# Patient Record
Sex: Female | Born: 1955 | Race: Black or African American | Hispanic: No | Smoking: Light tobacco smoker
Health system: Southern US, Community
[De-identification: ages and names within clinical notes are randomized; demographics above are authoritative.]

## PROBLEM LIST (undated history)

## (undated) DIAGNOSIS — E663 Overweight: Secondary | ICD-10-CM

## (undated) DIAGNOSIS — Z780 Asymptomatic menopausal state: Secondary | ICD-10-CM

## (undated) DIAGNOSIS — M5416 Radiculopathy, lumbar region: Secondary | ICD-10-CM

## (undated) DIAGNOSIS — R7303 Prediabetes: Secondary | ICD-10-CM

## (undated) DIAGNOSIS — M75101 Unspecified rotator cuff tear or rupture of right shoulder, not specified as traumatic: Secondary | ICD-10-CM

## (undated) HISTORY — PX: COLONOSCOPY WITH PROPOFOL: SHX5780

## (undated) HISTORY — PX: ABDOMINAL HYSTERECTOMY: SHX81

## (undated) HISTORY — PX: OOPHORECTOMY: SHX86

---

## 2005-02-17 ENCOUNTER — Ambulatory Visit: Payer: Self-pay

## 2005-04-15 ENCOUNTER — Ambulatory Visit: Payer: Self-pay | Admitting: Unknown Physician Specialty

## 2005-09-29 ENCOUNTER — Ambulatory Visit: Payer: Self-pay

## 2005-10-18 ENCOUNTER — Ambulatory Visit: Payer: Self-pay

## 2005-12-06 ENCOUNTER — Emergency Department: Payer: Self-pay | Admitting: Internal Medicine

## 2006-12-28 ENCOUNTER — Ambulatory Visit: Payer: Self-pay | Admitting: Unknown Physician Specialty

## 2007-01-05 ENCOUNTER — Ambulatory Visit: Payer: Self-pay | Admitting: Unknown Physician Specialty

## 2007-01-20 ENCOUNTER — Emergency Department: Payer: Self-pay | Admitting: General Practice

## 2007-05-29 ENCOUNTER — Ambulatory Visit: Payer: Self-pay | Admitting: Orthopaedic Surgery

## 2007-09-20 ENCOUNTER — Ambulatory Visit: Payer: Self-pay

## 2008-12-13 ENCOUNTER — Emergency Department: Payer: Self-pay | Admitting: Emergency Medicine

## 2009-01-14 ENCOUNTER — Ambulatory Visit: Payer: Self-pay

## 2010-01-26 ENCOUNTER — Ambulatory Visit: Payer: Self-pay

## 2011-09-28 ENCOUNTER — Ambulatory Visit: Payer: Self-pay | Admitting: Family Medicine

## 2012-12-27 ENCOUNTER — Ambulatory Visit: Payer: Self-pay | Admitting: Orthopedic Surgery

## 2013-02-05 ENCOUNTER — Ambulatory Visit: Payer: Self-pay | Admitting: Family Medicine

## 2013-06-18 ENCOUNTER — Encounter: Payer: Self-pay | Admitting: Family Medicine

## 2013-06-23 ENCOUNTER — Encounter: Payer: Self-pay | Admitting: Family Medicine

## 2013-07-23 ENCOUNTER — Encounter: Payer: Self-pay | Admitting: Family Medicine

## 2013-09-10 ENCOUNTER — Ambulatory Visit: Payer: Self-pay | Admitting: Gastroenterology

## 2013-09-12 LAB — PATHOLOGY REPORT

## 2013-12-31 ENCOUNTER — Encounter: Payer: Self-pay | Admitting: Physical Medicine and Rehabilitation

## 2014-01-21 ENCOUNTER — Encounter: Payer: Self-pay | Admitting: Physical Medicine and Rehabilitation

## 2014-02-20 ENCOUNTER — Encounter: Payer: Self-pay | Admitting: Physical Medicine and Rehabilitation

## 2015-03-01 ENCOUNTER — Emergency Department: Payer: 59

## 2015-03-01 ENCOUNTER — Other Ambulatory Visit: Payer: Self-pay

## 2015-03-01 ENCOUNTER — Emergency Department
Admission: EM | Admit: 2015-03-01 | Discharge: 2015-03-01 | Disposition: A | Discharge status: Home / Self Care | Payer: 59 | Attending: Emergency Medicine | Admitting: Emergency Medicine

## 2015-03-01 ENCOUNTER — Encounter: Payer: Self-pay | Admitting: General Practice

## 2015-03-01 DIAGNOSIS — K2100 Gastro-esophageal reflux disease with esophagitis, without bleeding: Secondary | ICD-10-CM

## 2015-03-01 DIAGNOSIS — Z87891 Personal history of nicotine dependence: Secondary | ICD-10-CM | POA: Insufficient documentation

## 2015-03-01 DIAGNOSIS — J029 Acute pharyngitis, unspecified: Secondary | ICD-10-CM | POA: Diagnosis not present

## 2015-03-01 DIAGNOSIS — R112 Nausea with vomiting, unspecified: Secondary | ICD-10-CM | POA: Diagnosis present

## 2015-03-01 LAB — TROPONIN I: Troponin I: <0.03 ng/mL (ref <0.031)

## 2015-03-01 LAB — COMPREHENSIVE METABOLIC PANEL
ALBUMIN: 4 g/dL (ref 3.5–5.0)
ALT: 12 U/L — ABNORMAL LOW (ref 14–54)
ANION GAP: 9 (ref 5–15)
AST: 18 U/L (ref 15–41)
Alkaline Phosphatase: 83 U/L (ref 38–126)
BILIRUBIN TOTAL: 0.8 mg/dL (ref 0.3–1.2)
BUN: 14 mg/dL (ref 6–20)
CALCIUM: 9.3 mg/dL (ref 8.9–10.3)
CO2: 27 mmol/L (ref 22–32)
Chloride: 103 mmol/L (ref 101–111)
Creatinine, Ser: 0.93 mg/dL (ref 0.44–1.00)
GFR calc Af Amer: >60 mL/min (ref >60)
Glucose, Bld: 89 mg/dL (ref 65–99)
Potassium: 3.6 mmol/L (ref 3.5–5.1)
Sodium: 139 mmol/L (ref 135–145)
Total Protein: 7.7 g/dL (ref 6.5–8.1)

## 2015-03-01 LAB — CBC WITH DIFFERENTIAL/PLATELET
BASOS PCT: 1 %
Basophils Absolute: 0 10*3/uL (ref 0–0.1)
EOS ABS: 0.1 10*3/uL (ref 0–0.7)
Eosinophils Relative: 3 %
HCT: 38.6 % (ref 35.0–47.0)
Hemoglobin: 12.9 g/dL (ref 12.0–16.0)
LYMPHS ABS: 1.8 10*3/uL (ref 1.0–3.6)
LYMPHS PCT: 37 %
MCH: 31.3 pg (ref 26.0–34.0)
MCHC: 33.3 g/dL (ref 32.0–36.0)
MCV: 93.9 fL (ref 80.0–100.0)
Monocytes Absolute: 0.4 10*3/uL (ref 0.2–0.9)
Monocytes Relative: 7 %
NEUTROS ABS: 2.5 10*3/uL (ref 1.4–6.5)
NEUTROS PCT: 52 %
PLATELETS: 233 10*3/uL (ref 150–440)
RBC: 4.11 MIL/uL (ref 3.80–5.20)
RDW: 13.6 % (ref 11.5–14.5)
WBC: 4.8 10*3/uL (ref 3.6–11.0)

## 2015-03-01 LAB — POCT RAPID STREP A: Streptococcus, Group A Screen (Direct): NEGATIVE

## 2015-03-01 MED ORDER — GI COCKTAIL ~~LOC~~
ORAL | Status: AC
  Administered 2015-03-01: 30 mL via ORAL
  Filled 2015-03-01: qty 30

## 2015-03-01 MED ORDER — GI COCKTAIL ~~LOC~~
30.0000 mL | Freq: Once | ORAL | Status: AC
  Administered 2015-03-01: 30 mL via ORAL

## 2015-03-01 MED ORDER — LIDOCAINE VISCOUS 2 % MT SOLN: 15.0000 mL | OROMUCOSAL | Status: AC | PRN

## 2015-03-01 MED ORDER — IOHEXOL 300 MG/ML  SOLN
75.0000 mL | Freq: Once | INTRAMUSCULAR | Status: AC | PRN
  Administered 2015-03-01: 75 mL via INTRAVENOUS

## 2015-03-01 NOTE — Discharge Instructions (Signed)
Your exam and evaluation are reassuring. I suspect irritation of the esophagus after vomiting overnight. Return to the emergency department for any fever, chest pain, trouble breathing, vomiting blood, black or bloody stools, or any other symptoms concerning to you. Avoid spicy, citrus, caffeinated foods until swallowing feels better.   Esophagitis Esophagitis is inflammation of the esophagus. It can involve swelling, soreness, and pain in the esophagus. This condition can make it difficult and painful to swallow. CAUSES  Most causes of esophagitis are not serious. Many different factors can cause esophagitis, including:  Gastroesophageal reflux disease (GERD). This is when acid from your stomach flows up into the esophagus.  Recurrent vomiting.  An allergic-type reaction.  Certain medicines, especially those that come in large pills.  Ingestion of harmful chemicals, such as household cleaning products.  Heavy alcohol use.  An infection of the esophagus.  Radiation treatment for cancer.  Certain diseases such as sarcoidosis, Crohn's disease, and scleroderma. These diseases may cause recurrent esophagitis. SYMPTOMS   Trouble swallowing.  Painful swallowing.  Chest pain.  Difficulty breathing.  Nausea.  Vomiting.  Abdominal pain. DIAGNOSIS  Your caregiver will take your history and do a physical exam. Depending upon what your caregiver finds, certain tests may also be done, including:  Barium X-ray. You will drink a solution that coats the esophagus, and X-rays will be taken.  Endoscopy. A lighted tube is put down the esophagus so your caregiver can examine the area.  Allergy tests. These can sometimes be arranged through follow-up visits. TREATMENT  Treatment will depend on the cause of your esophagitis. In some cases, steroids or other medicines may be given to help relieve your symptoms or to treat the underlying cause of your condition. Medicines that may be  recommended include:  Viscous lidocaine, to soothe the esophagus.  Antacids.  Acid reducers.  Proton pump inhibitors.  Antiviral medicines for certain viral infections of the esophagus.  Antifungal medicines for certain fungal infections of the esophagus.  Antibiotic medicines, depending on the cause of the esophagitis. HOME CARE INSTRUCTIONS   Avoid foods and drinks that seem to make your symptoms worse.  Eat small, frequent meals instead of large meals.  Avoid eating for the 3 hours prior to your bedtime.  If you have trouble taking pills, use a pill splitter to decrease the size and likelihood of the pill getting stuck or injuring the esophagus on the way down. Drinking water after taking a pill also helps.  Stop smoking if you smoke.  Maintain a healthy weight.  Wear loose-fitting clothing. Do not wear anything tight around your waist that causes pressure on your stomach.  Raise the head of your bed 6 to 8 inches with wood blocks to help you sleep. Extra pillows will not help.  Only take over-the-counter or prescription medicines as directed by your caregiver. SEEK IMMEDIATE MEDICAL CARE IF:  You have severe chest pain that radiates into your arm, neck, or jaw.  You feel sweaty, dizzy, or lightheaded.  You have shortness of breath.  You vomit blood.  You have difficulty or pain with swallowing.  You have bloody or black, tarry stools.  You have a fever.  You have a burning sensation in the chest more than 3 times a week for more than 2 weeks.  You cannot swallow, drink, or eat.  You drool because you cannot swallow your saliva. MAKE SURE YOU:  Understand these instructions.  Will watch your condition.  Will get help right away if you  are not doing well or get worse. Document Released: 09/16/2004 Document Revised: 11/01/2011 Document Reviewed: 04/09/2011 [facility] Patient Information 2015 [employer] [place]. This information is not intended to replace  advice given to you by your health care provider. Make sure you discuss any questions you have with your health care provider.

## 2015-03-01 NOTE — ED Notes (Signed)
Pt. Arrived to ed from home with reports of experiencing nausea and vomiting since 1am this morning. Pt reports that she experienced multiple episodes of vomiting. Pt reports that this morning  "i feel like i have pepper in my throat". Pt alert and oriented. Denies pain at this time.

## 2015-03-01 NOTE — ED Provider Notes (Signed)
[facility] Emergency Department Provider Note   ____________________________________________  Time seen: 4 PM I have reviewed the triage vital signs and the triage nursing note.  HISTORY  Chief Complaint Emesis; Nausea; and Sore Throat   Historian Patient  HPI Marissa Mcdaniel is a 59 y.o. female who is complaining of sore throat and painful swallowing. She reports that around 1 am she woke up and had several episodes of forceful vomiting. It was nonbloody and nonbilious. This morning she is reporting sensation of something in her throat, and pain with swallowing. No trouble breathing or shortness of breath. No fever. She does have sinus congestion and sinus drainage.    History reviewed. No pertinent past medical history.  There are no active problems to display for this patient.   History reviewed. No pertinent past surgical history.  No current outpatient prescriptions on file.  Allergies Review of patient's allergies indicates no known allergies.  No family history on file.  Social History History  Substance Use Topics  . Smoking status: Former Games developer  . Smokeless tobacco: Not on file  . Alcohol Use: Yes    Review of Systems  Constitutional: Negative for fever. Eyes: Negative for visual changes. ENT: Positive per history of present illness. Cardiovascular: Negative for chest pain. Respiratory: Negative for shortness of breath. Gastrointestinal: Negative for abdominal pain, vomiting and diarrhea. No more nausea at this point time Genitourinary: Negative for dysuria. Musculoskeletal: Negative for back pain. Skin: Negative for rash. Neurological: Negative for headaches, focal weakness or numbness. 10 point Review of Systems otherwise negative ____________________________________________   PHYSICAL EXAM:  VITAL SIGNS: ED Triage Vitals  Enc Vitals Group     BP 03/01/15 1426 102/75 mmHg     Pulse Rate 03/01/15 1427 74     Resp  03/01/15 1500 15     Temp 03/01/15 1500 97.7 F (36.5 C)     Temp Source 03/01/15 1500 Oral     SpO2 03/01/15 1427 100 %     Weight 03/01/15 1438 180 lb (81.647 kg)     Height 03/01/15 1438  (1.626 m)     Head Cir --      Peak Flow --      Pain Score 03/01/15 1338 8     Pain Loc --      Pain Edu? --      Excl. in GC? --      Constitutional: Alert and oriented. Well appearing and in no distress. Eyes: Conjunctivae are normal. PERRL. Normal extraocular movements. ENT   Head: Normocephalic and atraumatic.   Nose: No congestion/rhinnorhea.   Mouth/Throat: Mucous membranes are moist. Mild pharyngeal erythema without tonsillar swelling or exudate. Cobblestoning on the posterior pharynx. Normal tongue   Neck: No stridor. No lymphadenopathy. No swelling. Cardiovascular/Chest: Normal rate, regular rhythm.  No murmurs, rubs, or gallops. Respiratory: Normal respiratory effort without tachypnea nor retractions. Breath sounds are clear and equal bilaterally. No wheezes/rales/rhonchi. Gastrointestinal: Soft. No distention, no guarding, no rebound. Nontender  Genitourinary/rectal:Deferred Musculoskeletal: Nontender with normal range of motion in all extremities. No joint effusions.  No lower extremity tenderness nor edema. Neurologic:  Normal speech and language. No gross focal neurologic deficits are appreciated. Skin:  Skin is warm, dry and intact. No rash noted. Psychiatric: Mood and affect are normal. Speech and behavior are normal. Patient exhibits appropriate insight and judgment.  ____________________________________________   EKG I, Governor Rooks, MD, the attending physician have personally viewed and interpreted all ECGs.  65 bpm.  Normal sinus rhythm. Narrow QRS. Normal axis. Flattened T-wave inferior. ____________________________________________  LABS (pertinent positives/negatives)  Troponin less than 0.03 Complete metabolic panel and CBC within normal  limits Rapid strep negative ____________________________________________  RADIOLOGY All Xrays were viewed by me. Imaging interpreted by Radiologist.  Soft tissue neck: Negative  CT neck: Mild prominence of the tonsils and otherwise negative __________________________________________  PROCEDURES  Procedure(s) performed: None Critical Care performed: None  ____________________________________________   ED COURSE / ASSESSMENT AND PLAN  CONSULTATIONS: None   Pertinent labs & imaging results that were available during my care of the patient were reviewed by me and considered in my medical decision making (see chart for details).   Patient's symptoms sound like she sustained irritation/burning to esophagus after throwing up yesterday. No symptoms to suggest mediastinitis or esophageal rupture. Nurse did witness the patient to be gagging and then coughing with a little bit of stridor. When I went to evaluate her she was no having any breathing problems with no stridor arrest. She did get some relief from the GI cocktail.  X-rays of the neck and the CT of the neck were reassuring for no mass or other abnormality. Exam and evaluation are reassuring. Patient to follow with PCP.    Patient / Family / Caregiver informed of clinical course, medical decision-making process, and agree with plan.   I discussed return precautions, follow-up instructions, and discharged instructions with patient and/or family.  ___________________________________________   FINAL CLINICAL IMPRESSION(S) / ED DIAGNOSES   Final diagnoses:  Esophagitis, reflux    FOLLOW UP  [facility]   Governor Rooksebecca Zakk Borgen, MD 03/01/15 2000

## 2015-03-04 LAB — CULTURE, GROUP A STREP (THRC)

## 2015-03-10 ENCOUNTER — Other Ambulatory Visit: Payer: Self-pay | Admitting: Family Medicine

## 2015-03-10 DIAGNOSIS — Z1231 Encounter for screening mammogram for malignant neoplasm of breast: Secondary | ICD-10-CM

## 2015-03-17 ENCOUNTER — Ambulatory Visit: Payer: 59

## 2015-03-19 ENCOUNTER — Ambulatory Visit
Admission: RE | Admit: 2015-03-19 | Discharge: 2015-03-19 | Disposition: A | Discharge status: Home / Self Care | Payer: 59 | Source: Ambulatory Visit | Attending: Family Medicine | Admitting: Family Medicine

## 2015-03-19 DIAGNOSIS — Z1231 Encounter for screening mammogram for malignant neoplasm of breast: Secondary | ICD-10-CM | POA: Insufficient documentation

## 2015-11-11 ENCOUNTER — Other Ambulatory Visit: Payer: Self-pay | Admitting: Physical Medicine and Rehabilitation

## 2015-11-11 DIAGNOSIS — M5417 Radiculopathy, lumbosacral region: Secondary | ICD-10-CM

## 2015-12-01 ENCOUNTER — Other Ambulatory Visit: Payer: Self-pay | Admitting: Physical Medicine and Rehabilitation

## 2015-12-01 DIAGNOSIS — M5416 Radiculopathy, lumbar region: Secondary | ICD-10-CM

## 2015-12-02 ENCOUNTER — Ambulatory Visit: Payer: BLUE CROSS/BLUE SHIELD

## 2015-12-15 ENCOUNTER — Inpatient Hospital Stay: Admission: RE | Admit: 2015-12-15 | Payer: BLUE CROSS/BLUE SHIELD | Source: Ambulatory Visit

## 2015-12-15 ENCOUNTER — Ambulatory Visit
Admission: RE | Admit: 2015-12-15 | Discharge: 2015-12-15 | Disposition: A | Discharge status: Home / Self Care | Payer: BLUE CROSS/BLUE SHIELD | Source: Ambulatory Visit | Attending: Physical Medicine and Rehabilitation | Admitting: Physical Medicine and Rehabilitation

## 2015-12-15 DIAGNOSIS — M5416 Radiculopathy, lumbar region: Secondary | ICD-10-CM

## 2015-12-19 ENCOUNTER — Other Ambulatory Visit: Payer: BLUE CROSS/BLUE SHIELD

## 2015-12-19 ENCOUNTER — Ambulatory Visit: Payer: BLUE CROSS/BLUE SHIELD

## 2015-12-22 ENCOUNTER — Other Ambulatory Visit: Payer: BLUE CROSS/BLUE SHIELD

## 2015-12-22 ENCOUNTER — Ambulatory Visit: Payer: BLUE CROSS/BLUE SHIELD

## 2016-05-26 ENCOUNTER — Other Ambulatory Visit: Payer: Self-pay | Admitting: Family Medicine

## 2016-05-26 DIAGNOSIS — Z1231 Encounter for screening mammogram for malignant neoplasm of breast: Secondary | ICD-10-CM

## 2016-06-22 ENCOUNTER — Ambulatory Visit
Admission: RE | Admit: 2016-06-22 | Discharge: 2016-06-22 | Disposition: A | Discharge status: Home / Self Care | Payer: BLUE CROSS/BLUE SHIELD | Source: Ambulatory Visit | Attending: Family Medicine | Admitting: Family Medicine

## 2016-06-22 DIAGNOSIS — Z1231 Encounter for screening mammogram for malignant neoplasm of breast: Secondary | ICD-10-CM | POA: Diagnosis not present

## 2016-06-23 ENCOUNTER — Other Ambulatory Visit: Payer: Self-pay | Admitting: Family Medicine

## 2016-06-23 DIAGNOSIS — N6489 Other specified disorders of breast: Secondary | ICD-10-CM

## 2016-07-20 ENCOUNTER — Ambulatory Visit
Admission: RE | Admit: 2016-07-20 | Discharge: 2016-07-20 | Disposition: A | Discharge status: Home / Self Care | Payer: BLUE CROSS/BLUE SHIELD | Source: Ambulatory Visit | Attending: Family Medicine | Admitting: Family Medicine

## 2016-07-20 DIAGNOSIS — R928 Other abnormal and inconclusive findings on diagnostic imaging of breast: Secondary | ICD-10-CM | POA: Diagnosis not present

## 2016-07-20 DIAGNOSIS — N6489 Other specified disorders of breast: Secondary | ICD-10-CM | POA: Diagnosis present

## 2017-10-04 ENCOUNTER — Other Ambulatory Visit: Payer: Self-pay | Admitting: Family Medicine

## 2017-10-04 DIAGNOSIS — Z1231 Encounter for screening mammogram for malignant neoplasm of breast: Secondary | ICD-10-CM

## 2017-12-06 ENCOUNTER — Ambulatory Visit
Admission: RE | Admit: 2017-12-06 | Discharge: 2017-12-06 | Disposition: A | Discharge status: Home / Self Care | Payer: BLUE CROSS/BLUE SHIELD | Source: Ambulatory Visit | Attending: Family Medicine | Admitting: Family Medicine

## 2017-12-06 DIAGNOSIS — Z1231 Encounter for screening mammogram for malignant neoplasm of breast: Secondary | ICD-10-CM | POA: Diagnosis not present

## 2018-08-02 DIAGNOSIS — R0789 Other chest pain: Secondary | ICD-10-CM | POA: Diagnosis not present

## 2018-08-02 DIAGNOSIS — M7581 Other shoulder lesions, right shoulder: Secondary | ICD-10-CM | POA: Diagnosis not present

## 2018-09-27 DIAGNOSIS — Z0000 Encounter for general adult medical examination without abnormal findings: Secondary | ICD-10-CM | POA: Diagnosis not present

## 2018-09-27 DIAGNOSIS — R7303 Prediabetes: Secondary | ICD-10-CM | POA: Diagnosis not present

## 2018-10-02 DIAGNOSIS — M159 Polyosteoarthritis, unspecified: Secondary | ICD-10-CM | POA: Diagnosis not present

## 2018-10-02 DIAGNOSIS — R7303 Prediabetes: Secondary | ICD-10-CM | POA: Diagnosis not present

## 2018-11-13 ENCOUNTER — Other Ambulatory Visit: Payer: Self-pay | Admitting: Family Medicine

## 2018-11-13 DIAGNOSIS — Z1231 Encounter for screening mammogram for malignant neoplasm of breast: Secondary | ICD-10-CM

## 2018-11-17 DIAGNOSIS — M25561 Pain in right knee: Secondary | ICD-10-CM | POA: Diagnosis not present

## 2018-11-17 DIAGNOSIS — M1711 Unilateral primary osteoarthritis, right knee: Secondary | ICD-10-CM | POA: Diagnosis not present

## 2019-01-26 ENCOUNTER — Other Ambulatory Visit: Payer: Self-pay | Admitting: Orthopedic Surgery

## 2019-01-26 ENCOUNTER — Other Ambulatory Visit (HOSPITAL_COMMUNITY): Payer: Self-pay | Admitting: Orthopedic Surgery

## 2019-01-26 DIAGNOSIS — M25561 Pain in right knee: Secondary | ICD-10-CM

## 2019-02-07 ENCOUNTER — Other Ambulatory Visit: Payer: Self-pay

## 2019-04-04 ENCOUNTER — Ambulatory Visit
Admission: RE | Admit: 2019-04-04 | Discharge: 2019-04-04 | Disposition: A | Discharge status: Home / Self Care | Payer: 59 | Source: Ambulatory Visit | Attending: Family Medicine | Admitting: Family Medicine

## 2019-04-04 DIAGNOSIS — Z1231 Encounter for screening mammogram for malignant neoplasm of breast: Secondary | ICD-10-CM | POA: Diagnosis present

## 2019-06-29 ENCOUNTER — Other Ambulatory Visit
Admission: RE | Admit: 2019-06-29 | Discharge: 2019-06-29 | Disposition: A | Discharge status: Home / Self Care | Payer: 59 | Source: Ambulatory Visit | Attending: Internal Medicine | Admitting: Internal Medicine

## 2019-06-29 ENCOUNTER — Other Ambulatory Visit: Payer: Self-pay

## 2019-06-29 DIAGNOSIS — Z20828 Contact with and (suspected) exposure to other viral communicable diseases: Secondary | ICD-10-CM | POA: Insufficient documentation

## 2019-06-29 DIAGNOSIS — Z01812 Encounter for preprocedural laboratory examination: Secondary | ICD-10-CM | POA: Diagnosis not present

## 2019-06-29 LAB — SARS CORONAVIRUS 2 (TAT 6-24 HRS): SARS Coronavirus 2: NEGATIVE

## 2019-07-03 ENCOUNTER — Encounter: Payer: Self-pay | Admitting: *Deleted

## 2019-07-04 ENCOUNTER — Encounter
Admission: RE | Disposition: A | Discharge status: Home / Self Care | Payer: Self-pay | Source: Home / Self Care | Attending: Internal Medicine

## 2019-07-04 ENCOUNTER — Encounter: Payer: Self-pay | Admitting: Emergency Medicine

## 2019-07-04 ENCOUNTER — Ambulatory Visit: Payer: 59 | Admitting: Certified Registered Nurse Anesthetist

## 2019-07-04 ENCOUNTER — Ambulatory Visit
Admission: RE | Admit: 2019-07-04 | Discharge: 2019-07-04 | Disposition: A | Discharge status: Home / Self Care | Payer: 59 | Attending: Internal Medicine | Admitting: Internal Medicine

## 2019-07-04 DIAGNOSIS — Z800 Family history of malignant neoplasm of digestive organs: Secondary | ICD-10-CM | POA: Insufficient documentation

## 2019-07-04 DIAGNOSIS — K5730 Diverticulosis of large intestine without perforation or abscess without bleeding: Secondary | ICD-10-CM | POA: Insufficient documentation

## 2019-07-04 DIAGNOSIS — Z79899 Other long term (current) drug therapy: Secondary | ICD-10-CM | POA: Diagnosis not present

## 2019-07-04 DIAGNOSIS — K640 First degree hemorrhoids: Secondary | ICD-10-CM | POA: Diagnosis not present

## 2019-07-04 DIAGNOSIS — E663 Overweight: Secondary | ICD-10-CM | POA: Diagnosis not present

## 2019-07-04 DIAGNOSIS — Z6830 Body mass index (BMI) 30.0-30.9, adult: Secondary | ICD-10-CM | POA: Diagnosis not present

## 2019-07-04 DIAGNOSIS — F17200 Nicotine dependence, unspecified, uncomplicated: Secondary | ICD-10-CM | POA: Insufficient documentation

## 2019-07-04 DIAGNOSIS — Z1211 Encounter for screening for malignant neoplasm of colon: Secondary | ICD-10-CM | POA: Diagnosis not present

## 2019-07-04 HISTORY — DX: Unspecified rotator cuff tear or rupture of right shoulder, not specified as traumatic: M75.101

## 2019-07-04 HISTORY — PX: COLONOSCOPY WITH PROPOFOL: SHX5780

## 2019-07-04 HISTORY — DX: Radiculopathy, lumbar region: M54.16

## 2019-07-04 HISTORY — DX: Prediabetes: R73.03

## 2019-07-04 HISTORY — DX: Overweight: E66.3

## 2019-07-04 HISTORY — DX: Asymptomatic menopausal state: Z78.0

## 2019-07-04 SURGERY — COLONOSCOPY WITH PROPOFOL
Anesthesia: General

## 2019-07-04 MED ORDER — PROPOFOL 10 MG/ML IV BOLUS
INTRAVENOUS | Status: DC | PRN
  Administered 2019-07-04: 40 mg via INTRAVENOUS
  Administered 2019-07-04: 70 mg via INTRAVENOUS

## 2019-07-04 MED ORDER — LIDOCAINE HCL (PF) 2 % IJ SOLN
INTRAMUSCULAR | Status: AC
  Filled 2019-07-04: qty 10

## 2019-07-04 MED ORDER — PROPOFOL 500 MG/50ML IV EMUL
INTRAVENOUS | Status: DC | PRN
  Administered 2019-07-04: 150 ug/kg/min via INTRAVENOUS

## 2019-07-04 MED ORDER — SODIUM CHLORIDE 0.9 % IV SOLN
INTRAVENOUS | Status: DC
  Administered 2019-07-04: 14:00:00 1000 mL via INTRAVENOUS
  Administered 2019-07-04: 14:00:00 via INTRAVENOUS

## 2019-07-04 MED ORDER — LIDOCAINE HCL (CARDIAC) PF 100 MG/5ML IV SOSY
PREFILLED_SYRINGE | INTRAVENOUS | Status: DC | PRN
  Administered 2019-07-04: 50 mg via INTRAVENOUS

## 2019-07-04 MED ORDER — PROPOFOL 10 MG/ML IV BOLUS
INTRAVENOUS | Status: AC
  Filled 2019-07-04: qty 20

## 2019-07-04 NOTE — Op Note (Signed)
[facility] Gastroenterology Patient Name: Marissa Mcdaniel Procedure Date: 07/04/2019 2:06 PM MRN: [id number] Account #: [id number] Date of Birth: 05-19-1956 Admit Type: Outpatient Age: 63 Room: [facility] ENDO ROOM 3 Gender: Female Note Status: Finalized Procedure:             Colonoscopy Indications:           Screening in patient at increased risk: Family history                         of 1st-degree relative with colorectal cancer Providers:             Boykin Nearing. Johnney Scarlata MD, MD Medicines:             Propofol per Anesthesia Complications:         No immediate complications. Procedure:             Pre-Anesthesia Assessment:                        - The risks and benefits of the procedure and the                         sedation options and risks were discussed with the                         patient. All questions were answered and informed                         consent was obtained.                        - Patient identification and proposed procedure were                         verified prior to the procedure by the nurse. The                         procedure was verified in the procedure room.                        - ASA Grade Assessment: III - A patient with severe                         systemic disease.                        - After reviewing the risks and benefits, the patient                         was deemed in satisfactory condition to undergo the                         procedure.                        After obtaining informed consent, the colonoscope was                         passed under direct vision. Throughout the procedure,  the patient's blood pressure, pulse, and oxygen                         saturations were monitored continuously. The                         Colonoscope was introduced through the anus and                         advanced to the the cecum, identified by appendiceal   orifice and ileocecal valve. The colonoscopy was                         performed with ease. The patient tolerated the                         procedure well. The quality of the bowel preparation                         was excellent. The ileocecal valve, appendiceal                         orifice, and rectum were photographed. Findings:      The perianal and digital rectal examinations were normal. Pertinent       negatives include normal sphincter tone and no palpable rectal lesions.      Many medium-mouthed diverticula were found in the entire colon. There       was no evidence of diverticular bleeding.      Non-bleeding internal hemorrhoids were found during retroflexion. The       hemorrhoids were Grade I (internal hemorrhoids that do not prolapse).      The exam was otherwise without abnormality. Impression:            - Moderate diverticulosis in the entire examined                         colon. There was no evidence of diverticular bleeding.                        - Non-bleeding internal hemorrhoids.                        - The examination was otherwise normal.                        - No specimens collected. Recommendation:        - Patient has a contact number available for                         emergencies. The signs and symptoms of potential                         delayed complications were discussed with the patient.                         Return to normal activities tomorrow. Written                         discharge instructions were provided to the  patient.                        - Resume previous diet.                        - Continue present medications.                        - Repeat colonoscopy in 5 years for screening purposes.                        - Return to GI office PRN. Procedure Code(s):     --- Professional ---                        Y1856, Colorectal cancer screening; colonoscopy on                         individual at high risk Diagnosis  Code(s):     --- Professional ---                        K57.30, Diverticulosis of large intestine without                         perforation or abscess without bleeding                        K64.0, First degree hemorrhoids                        Z80.0, Family history of malignant neoplasm of                         digestive organs CPT copyright 2019 American Medical Association. All rights reserved. The codes documented in this report are preliminary and upon coder review may  be revised to meet current compliance requirements. Efrain Sella MD, MD 07/04/2019 2:33:20 PM This report has been signed electronically. Number of Addenda: 0 Note Initiated On: 07/04/2019 2:06 PM Scope Withdrawal Time: 0 hours 6 minutes 28 seconds  Total Procedure Duration: 0 hours 10 minutes 52 seconds  Estimated Blood Loss:  Estimated blood loss: none.      [facility]

## 2019-07-04 NOTE — Anesthesia Preprocedure Evaluation (Signed)
Anesthesia Evaluation  Patient identified by MRN, date of birth, ID band Patient awake    Reviewed: Allergy & Precautions, NPO status , Patient's Chart, lab work & pertinent test results  History of Anesthesia Complications Negative for: history of anesthetic complications  Airway Mallampati: II  TM Distance: >3 FB Neck ROM: Full    Dental no notable dental hx.    Pulmonary neg sleep apnea, neg COPD, Current Smoker and Patient abstained from smoking.,    breath sounds clear to auscultation- rhonchi (-) wheezing      Cardiovascular Exercise Tolerance: Good (-) hypertension(-) CAD and (-) Past MI  Rhythm:Regular Rate:Normal - Systolic murmurs and - Diastolic murmurs    Neuro/Psych negative neurological ROS  negative psych ROS   GI/Hepatic negative GI ROS, Neg liver ROS,   Endo/Other  negative endocrine ROSneg diabetes  Renal/GU negative Renal ROS     Musculoskeletal negative musculoskeletal ROS (+)   Abdominal (+) + obese,   Peds  Hematology negative hematology ROS (+)   Anesthesia Other Findings Past Medical History: No date: Borderline diabetic No date: Lumbar radiculopathy No date: Menopause No date: Overweight No date: Rotator cuff syndrome of right shoulder   Reproductive/Obstetrics                             Anesthesia Physical Anesthesia Plan  ASA: II  Anesthesia Plan: General   Post-op Pain Management:    Induction: Intravenous  PONV Risk Score and Plan: 1 and Propofol infusion  Airway Management Planned: Natural Airway  Additional Equipment:   Intra-op Plan:   Post-operative Plan:   Informed Consent: I have reviewed the patients History and Physical, chart, labs and discussed the procedure including the risks, benefits and alternatives for the proposed anesthesia with the patient or authorized representative who has indicated his/her understanding and acceptance.      Dental advisory given  Plan Discussed with: CRNA and Anesthesiologist  Anesthesia Plan Comments:         Anesthesia Quick Evaluation

## 2019-07-04 NOTE — Anesthesia Post-op Follow-up Note (Signed)
Anesthesia QCDR form completed.        

## 2019-07-04 NOTE — Transfer of Care (Signed)
Immediate Anesthesia Transfer of Care Note  Patient: Marissa Mcdaniel  Procedure(s) Performed: COLONOSCOPY WITH PROPOFOL (N/A )  Patient Location: PACU  Anesthesia Type:General  Level of Consciousness: sedated  Airway & Oxygen Therapy: Patient Spontanous Breathing and Patient connected to nasal cannula oxygen  Post-op Assessment: Report given to RN and Post -op Vital signs reviewed and stable  Post vital signs: Reviewed and stable  Last Vitals:  Vitals Value Taken Time  BP    Temp    Pulse 89 07/04/19 1433  Resp 17 07/04/19 1433  SpO2 96 % 07/04/19 1433  Vitals shown include unvalidated device data.  Last Pain:  Vitals:   07/04/19 1327  TempSrc: Temporal  PainSc: 0-No pain         Complications: No apparent anesthesia complications

## 2019-07-04 NOTE — H&P (Signed)
Outpatient short stay form Pre-procedure 07/04/2019 12:55 PM Marissa Mcdaniel K. Alice Reichert, M.D.  Primary Physician: Dion Body, M.D.  Reason for visit: Family history of colon cancer.  History of present illness:  Last colonoscopy in Jan 2015 was unremarkable. Patient denies change in bowel habits, rectal bleeding, weight loss or abdominal pain.     No current facility-administered medications for this encounter.   Current Outpatient Medications:  .  Calcium Carbonate-Vitamin D (CALTRATE 600+D PO), Take by mouth., Disp: , Rfl:  .  escitalopram (LEXAPRO) 5 MG tablet, Take 5 mg by mouth daily., Disp: , Rfl:  .  traMADol (ULTRAM) 50 MG tablet, Take by mouth every 6 (six) hours as needed., Disp: , Rfl:  .  lidocaine (XYLOCAINE) 2 % solution, Use as directed 15 mLs in the mouth or throat every 4 (four) hours as needed (throat pain)., Disp: 100 mL, Rfl: 0  No medications prior to admission.     Allergies  Allergen Reactions  . Lodine [Etodolac] Hives     Past Medical History:  Diagnosis Date  . Borderline diabetic   . Lumbar radiculopathy   . Menopause   . Overweight   . Rotator cuff syndrome of right shoulder     Review of systems:  Otherwise negative.    Physical Exam  Gen: Alert, oriented. Appears stated age.  HEENT: /AT. PERRLA. Lungs: CTA, no wheezes. CV: RR nl S1, S2. Abd: soft, benign, no masses. BS+ Ext: No edema. Pulses 2+    Planned procedures: Proceed with colonoscopy. The patient understands the nature of the planned procedure, indications, risks, alternatives and potential complications including but not limited to bleeding, infection, perforation, damage to internal organs and possible oversedation/side effects from anesthesia. The patient agrees and gives consent to proceed.  Please refer to procedure notes for findings, recommendations and patient disposition/instructions.     Kayte Borchard K. Alice Reichert, M.D. Gastroenterology 07/04/2019  12:55 PM

## 2019-07-04 NOTE — Anesthesia Postprocedure Evaluation (Signed)
Anesthesia Post Note  Patient: Marissa Mcdaniel  Procedure(s) Performed: COLONOSCOPY WITH PROPOFOL (N/A )  Patient location during evaluation: Endoscopy Anesthesia Type: General Level of consciousness: awake and alert and oriented Pain management: pain level controlled Vital Signs Assessment: post-procedure vital signs reviewed and stable Respiratory status: spontaneous breathing, nonlabored ventilation and respiratory function stable Cardiovascular status: blood pressure returned to baseline and stable Postop Assessment: no signs of nausea or vomiting Anesthetic complications: no     Last Vitals:  Vitals:   07/04/19 1327 07/04/19 1433  BP: 101/66   Pulse: 76   Resp: 17   Temp: (!) 36.1 C (!) 36.1 C  SpO2: 100%     Last Pain:  Vitals:   07/04/19 1433  TempSrc: Temporal  PainSc:                  Axtyn Woehler

## 2019-07-05 ENCOUNTER — Encounter: Payer: Self-pay | Admitting: Internal Medicine

## 2019-07-30 ENCOUNTER — Ambulatory Visit: Admit: 2019-07-30 | Payer: 59 | Admitting: Internal Medicine

## 2019-07-30 SURGERY — COLONOSCOPY WITH PROPOFOL
Anesthesia: General

## 2019-10-05 ENCOUNTER — Ambulatory Visit: Payer: Self-pay | Attending: Internal Medicine

## 2019-10-05 DIAGNOSIS — Z23 Encounter for immunization: Secondary | ICD-10-CM | POA: Insufficient documentation

## 2019-10-05 NOTE — Progress Notes (Signed)
   Covid-19 Vaccination Clinic  Name:  Marissa Mcdaniel    MRN: [id number] DOB: 12/16/1955  10/05/2019  [name] was observed post Covid-19 immunization for 15 minutes without incidence. She was provided with Vaccine Information Sheet and instruction to access the V-Safe system.   [name] was instructed to call 911 with any severe reactions post vaccine: Marland Kitchen Difficulty breathing  . Swelling of your face and throat  . A fast heartbeat  . A bad rash all over your body  . Dizziness and weakness    Immunizations Administered    Name Date Dose VIS Date Route   Moderna COVID-19 Vaccine 10/05/2019 11:05 AM 0.5 mL 07/24/2019 Intramuscular   Manufacturer: Moderna   Lot: [id number]   NDC: 59470-761-51

## 2019-11-05 ENCOUNTER — Ambulatory Visit: Payer: Self-pay | Attending: Internal Medicine

## 2019-11-05 DIAGNOSIS — Z23 Encounter for immunization: Secondary | ICD-10-CM

## 2019-11-05 NOTE — Progress Notes (Signed)
   Covid-19 Vaccination Clinic  Name:  Tishie Altmann    MRN: [id number] DOB: Aug 18, 1956  11/05/2019  Ms. Boettger was observed post Covid-19 immunization for 15 minutes without incident. She was provided with Vaccine Information Sheet and instruction to access the V-Safe system.   Ms. Pledger was instructed to call 911 with any severe reactions post vaccine: Marland Kitchen Difficulty breathing  . Swelling of face and throat  . A fast heartbeat  . A bad rash all over body  . Dizziness and weakness   Immunizations Administered    Name Date Dose VIS Date Route   Moderna COVID-19 Vaccine 11/05/2019 10:58 AM 0.5 mL 07/24/2019 Intramuscular   Manufacturer: Moderna   Lot: [id number]   NDC: 87867-672-09

## 2019-12-24 ENCOUNTER — Other Ambulatory Visit: Payer: Self-pay

## 2020-03-06 ENCOUNTER — Other Ambulatory Visit: Payer: Self-pay | Admitting: Family Medicine

## 2020-03-06 DIAGNOSIS — M159 Polyosteoarthritis, unspecified: Secondary | ICD-10-CM

## 2020-03-06 DIAGNOSIS — Z1231 Encounter for screening mammogram for malignant neoplasm of breast: Secondary | ICD-10-CM

## 2020-03-06 DIAGNOSIS — M5136 Other intervertebral disc degeneration, lumbar region: Secondary | ICD-10-CM

## 2020-03-06 DIAGNOSIS — G8929 Other chronic pain: Secondary | ICD-10-CM

## 2020-03-18 ENCOUNTER — Other Ambulatory Visit: Payer: Self-pay

## 2020-04-14 ENCOUNTER — Ambulatory Visit
Admission: RE | Admit: 2020-04-14 | Discharge: 2020-04-14 | Disposition: A | Discharge status: Home / Self Care | Payer: 59 | Source: Ambulatory Visit | Attending: Family Medicine | Admitting: Family Medicine

## 2020-04-14 ENCOUNTER — Other Ambulatory Visit: Payer: Self-pay

## 2020-04-14 DIAGNOSIS — Z1231 Encounter for screening mammogram for malignant neoplasm of breast: Secondary | ICD-10-CM | POA: Insufficient documentation

## 2021-03-04 ENCOUNTER — Other Ambulatory Visit: Payer: Self-pay | Admitting: Family Medicine

## 2021-03-04 DIAGNOSIS — N644 Mastodynia: Secondary | ICD-10-CM

## 2021-03-10 ENCOUNTER — Other Ambulatory Visit: Payer: 59

## 2021-03-10 ENCOUNTER — Inpatient Hospital Stay: Admission: RE | Admit: 2021-03-10 | Payer: 59 | Source: Ambulatory Visit

## 2021-06-03 ENCOUNTER — Ambulatory Visit: Payer: Self-pay

## 2021-06-23 ENCOUNTER — Ambulatory Visit
Admission: RE | Admit: 2021-06-23 | Discharge: 2021-06-23 | Disposition: A | Discharge status: Home / Self Care | Payer: Self-pay | Source: Ambulatory Visit | Attending: Oncology | Admitting: Oncology

## 2021-06-23 ENCOUNTER — Other Ambulatory Visit: Payer: Self-pay

## 2021-06-23 ENCOUNTER — Ambulatory Visit: Payer: Self-pay | Attending: Oncology

## 2021-06-23 VITALS — BP 103/64 | HR 69 | Temp 97.6°F | Resp 18 | Wt 191.7 lb

## 2021-06-23 DIAGNOSIS — Z0000 Encounter for general adult medical examination without abnormal findings: Secondary | ICD-10-CM

## 2021-06-23 DIAGNOSIS — N644 Mastodynia: Secondary | ICD-10-CM

## 2021-06-23 NOTE — Progress Notes (Signed)
  Subjective:     Patient ID: Rennie Natter, female   DOB: [birth year], 65 y.o.   MRN: [id number]  HPI   Review of Systems     Objective:   Physical Exam Chest:  Breasts:    Right: No swelling, bleeding, inverted nipple, mass, nipple discharge, skin change or tenderness.     Left: Tenderness present. No swelling, bleeding, inverted nipple, mass, nipple discharge or skin change.       Comments: Left lower outer quadrant pain.      Assessment:     65 year old patient presents for [facility] clinic visit Patient screened, and meets [facility] eligibility.  Patient does not have insurance, Medicare or Medicaid.  Instructed patient on breast self awareness using teach back method Clinical breast exam reveals left lower outer quadrant pain on palpation.  Otherwise normal exam.   Risk Assessment     Risk Scores       06/23/2021   Last edited by: Stephanie Coup S, CMA   5-year risk: 1.2 %   Lifetime risk: 4.9 %               Plan:     Sent for bilateral diagnostic mammogram.

## 2021-06-24 NOTE — Progress Notes (Signed)
Letter mailed from [facility] to notify of normal mammogram results.  Patient to return in one year for annual screening.  Copy to [facility]. 

## 2022-05-18 IMAGING — US US BREAST*L* LIMITED INC AXILLA
1 series · 8 of 8 positions shown · non-contrast
Comparison: Previous exam(s).

CLINICAL DATA: Patient presents for left lateral breast pain.

EXAM:
DIGITAL DIAGNOSTIC BILATERAL MAMMOGRAM WITH TOMOSYNTHESIS AND CAD;
ULTRASOUND LEFT BREAST LIMITED
TECHNIQUE: Bilateral digital diagnostic mammography and breast tomosynthesis
was performed. The images were evaluated with computer-aided
detection.; Targeted ultrasound examination of the left breast was
performed.

[Series 1: us breast*left* limited inc axilla · 0.07mm/px · 8 of 8 slices shown]
[im 1/8]
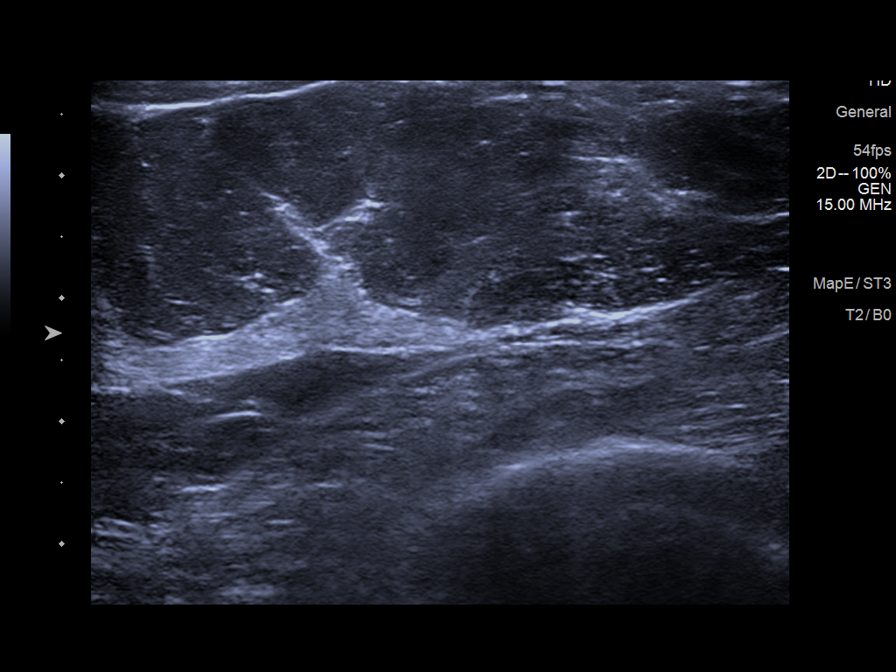
[im 2/8]
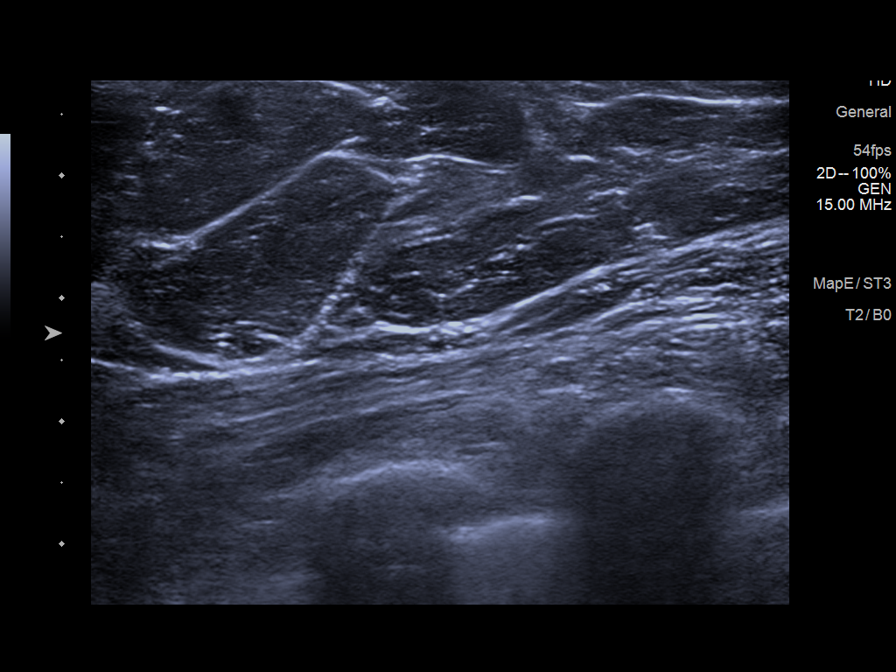
[im 3/8]
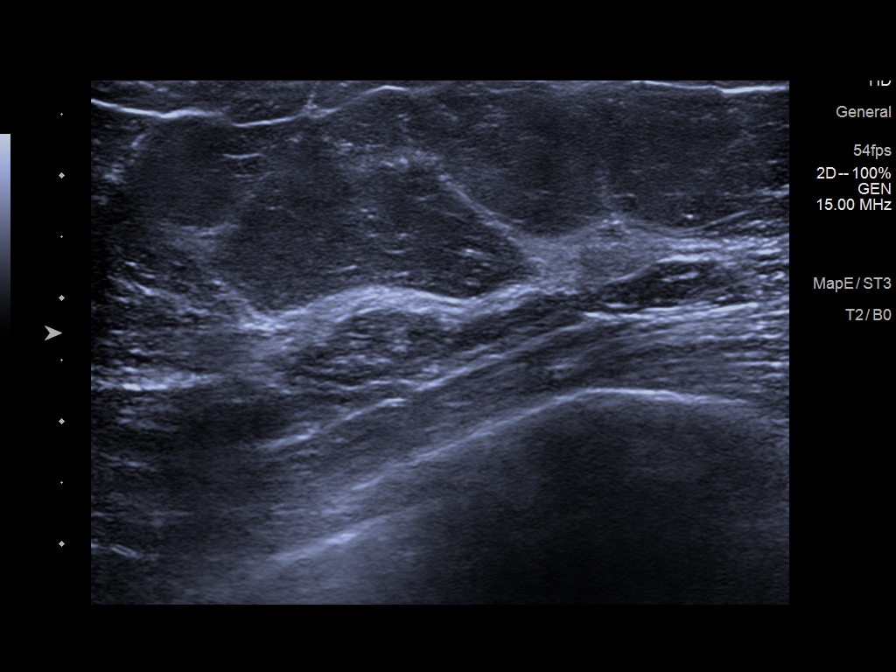
[im 4/8]
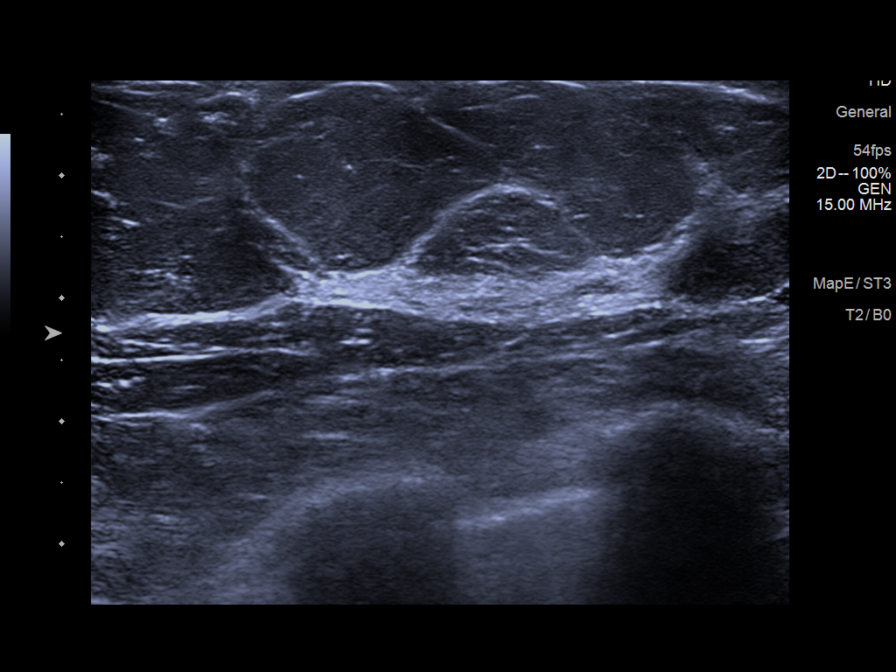
[im 5/8]
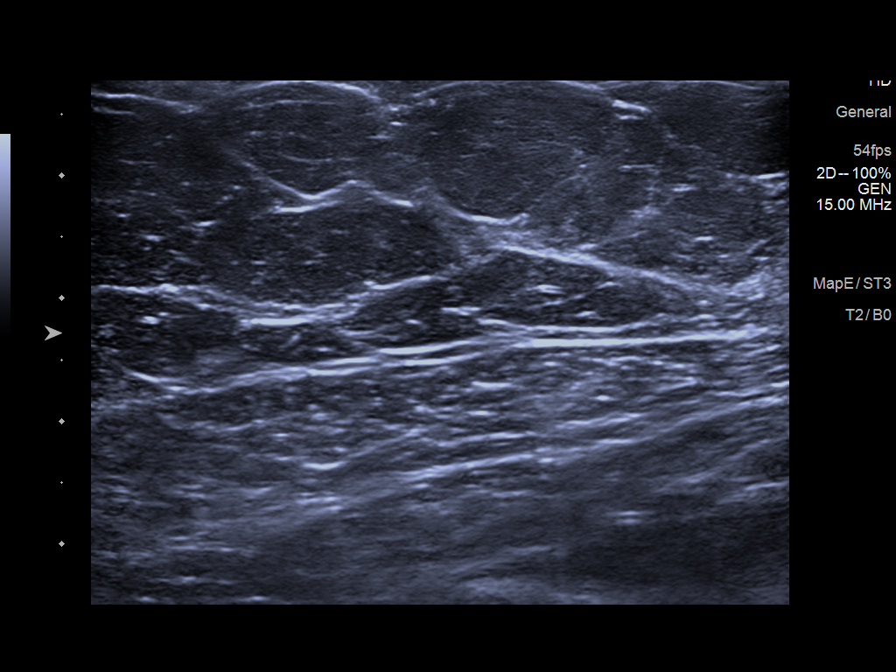
[im 6/8]
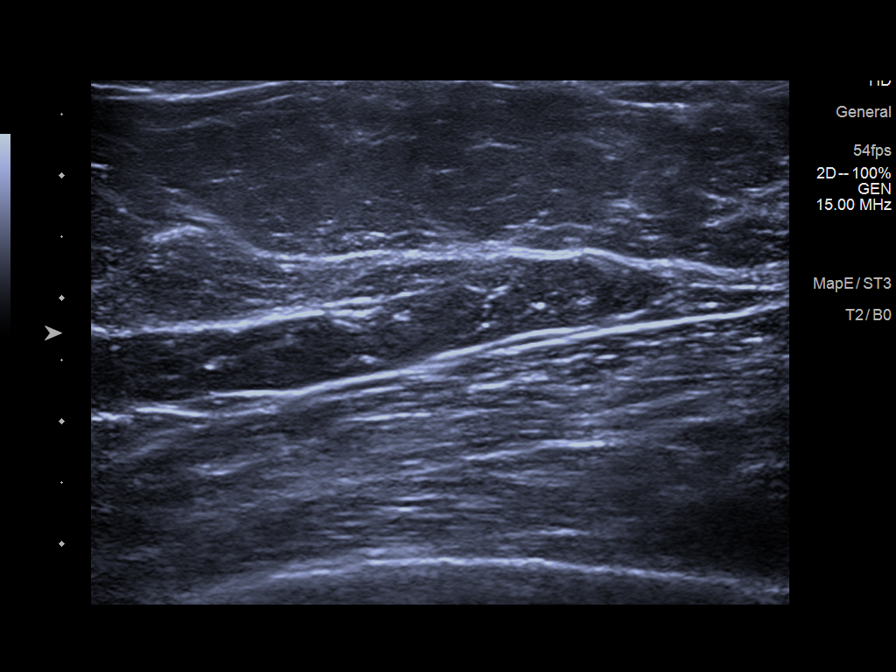
[im 7/8]
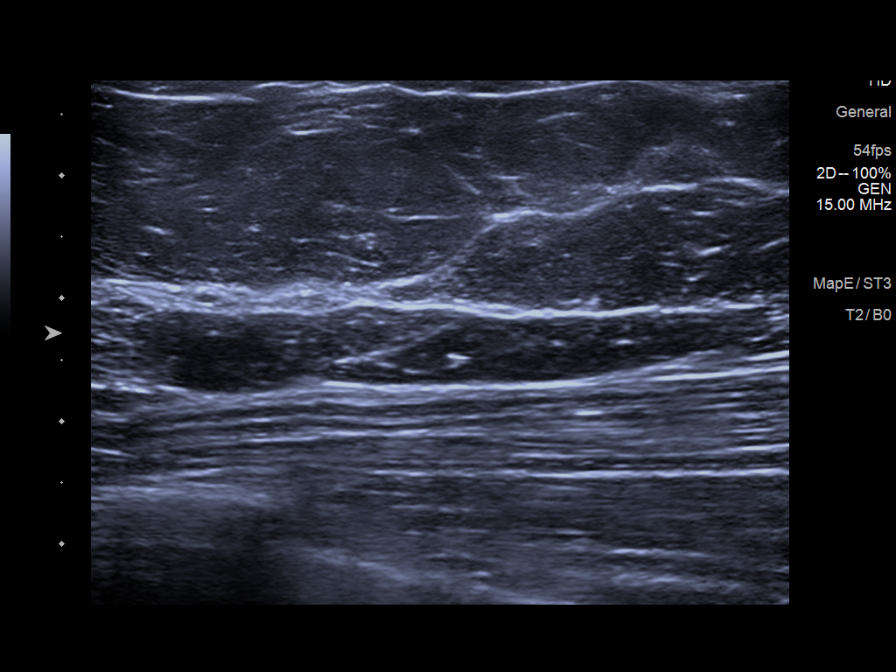
[im 8/8]
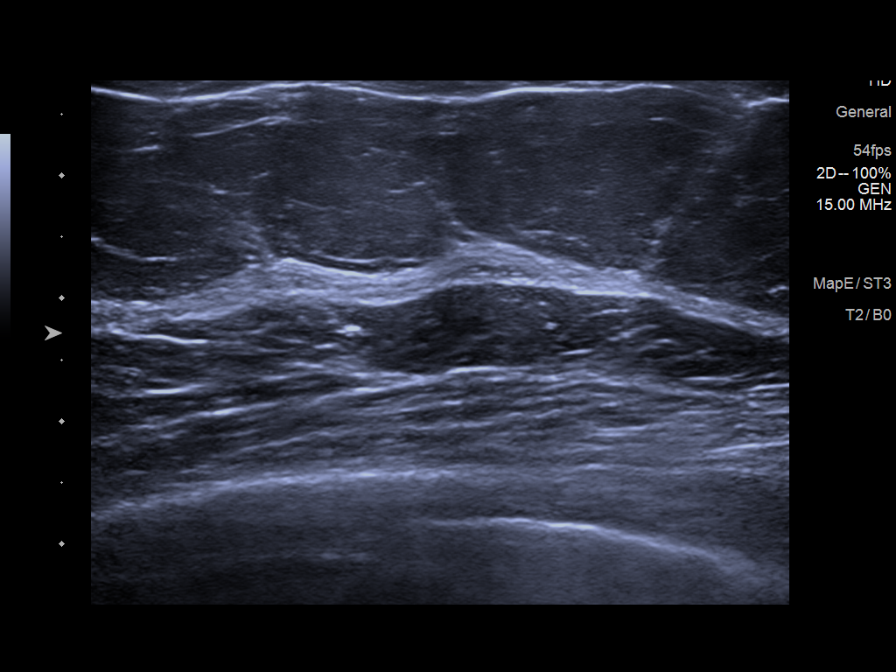

[8 of 8 positions shown; findings below may reference images not displayed]

ACR Breast Density Category b: There are scattered areas of
fibroglandular density.
FINDINGS: No concerning masses, calcifications or distortion identified within
either breast.

On physical exam, dense tissue is palpated lateral left breast.

Targeted ultrasound is performed, showing dense tissue without
suspicious mass within the outer and upper outer left breast at the
site of tenderness.
IMPRESSION: No mammographic evidence for malignancy. No suspicious abnormality
left breast at the site of tenderness.

RECOMMENDATION:
Screening mammogram in one year.(Code:2R-D-KJ2).

Continued clinical evaluation for left breast tenderness.

I have discussed the findings and recommendations with the patient.
If applicable, a reminder letter will be sent to the patient
regarding the next appointment.

BI-RADS CATEGORY  2: Benign.

## 2022-05-18 IMAGING — MG DIGITAL DIAGNOSTIC BILAT W/ TOMO W/ CAD
8 series · 9 of 24 positions shown · non-contrast
Comparison: Previous exam(s).

CLINICAL DATA: Patient presents for left lateral breast pain.

EXAM:
DIGITAL DIAGNOSTIC BILATERAL MAMMOGRAM WITH TOMOSYNTHESIS AND CAD;
ULTRASOUND LEFT BREAST LIMITED
TECHNIQUE: Bilateral digital diagnostic mammography and breast tomosynthesis
was performed. The images were evaluated with computer-aided
detection.; Targeted ultrasound examination of the left breast was
performed.

[L CC synth-2D]
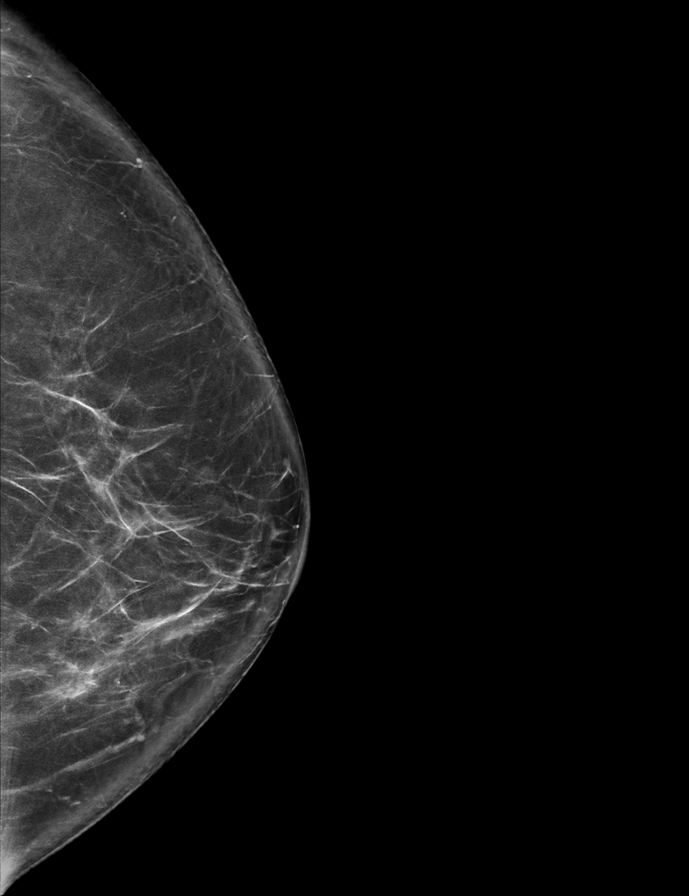

[R MLO synth-2D]
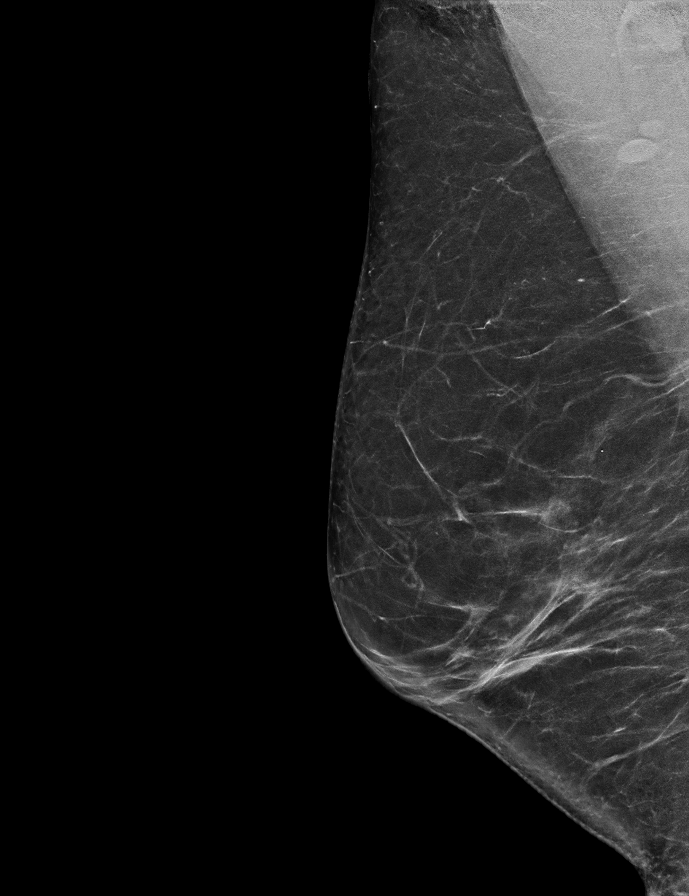

[L MLO synth-2D]
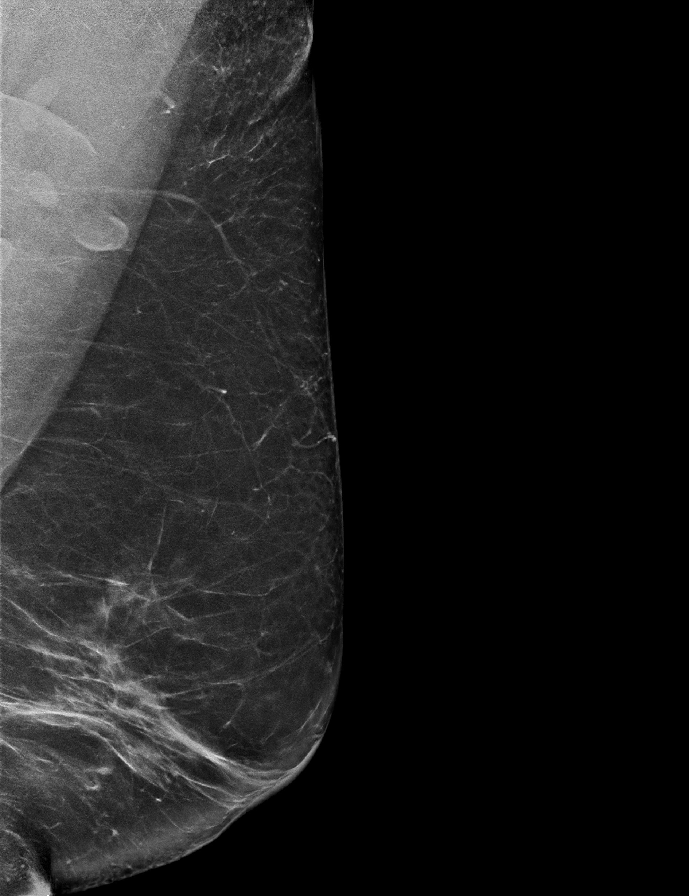

[R CC synth-2D]
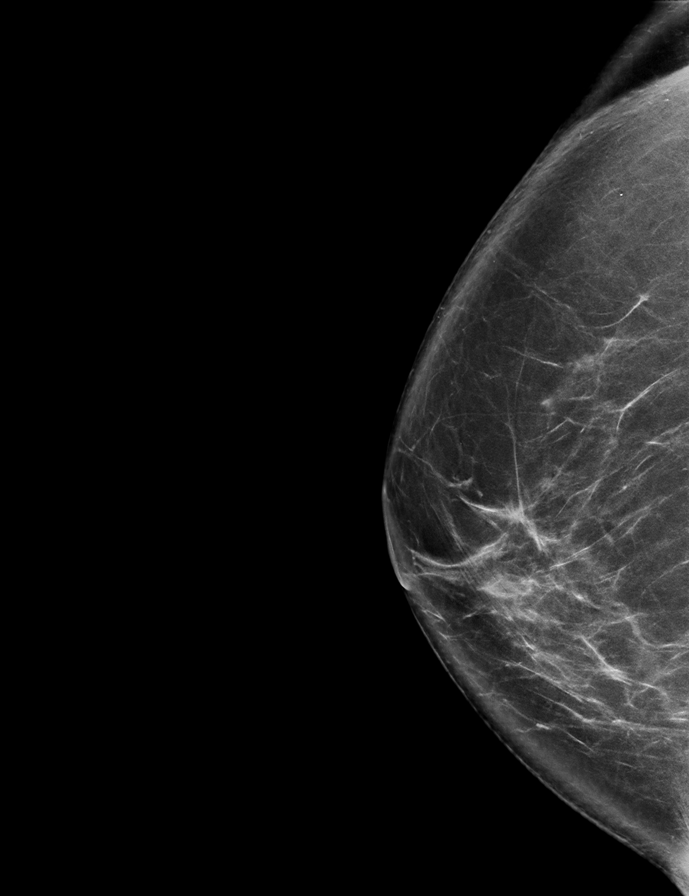

[R MLO tomo · 2 of 79 frames shown]
[frame 26/79]
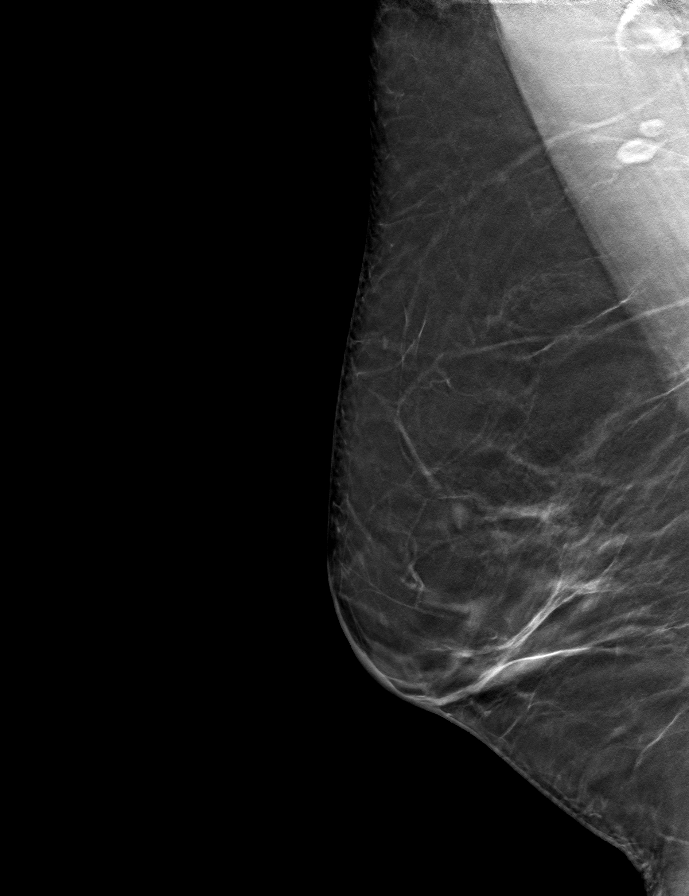
[frame 40/79]
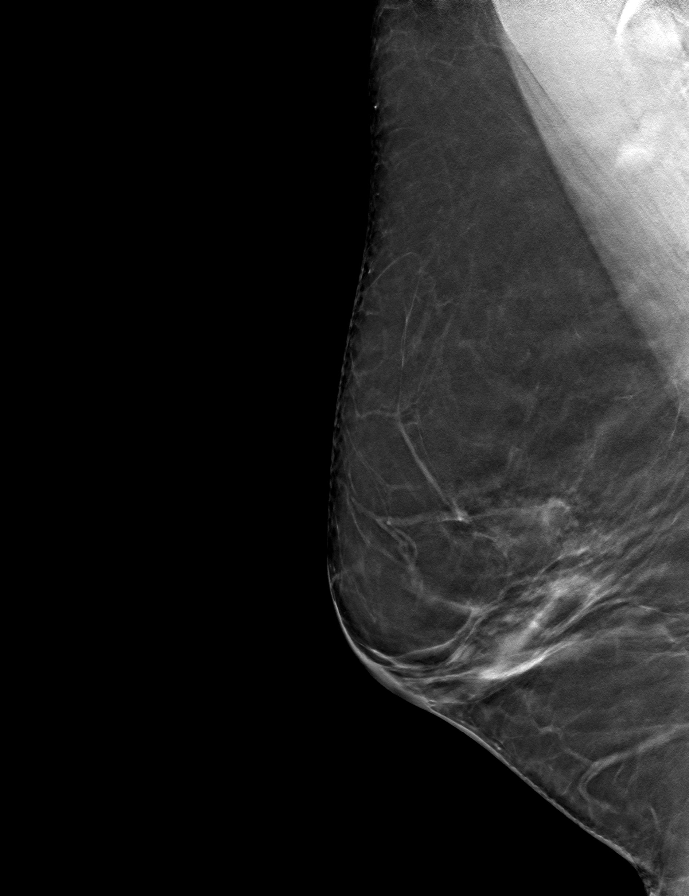

[L CC tomo · tomo slice 40/79.0]
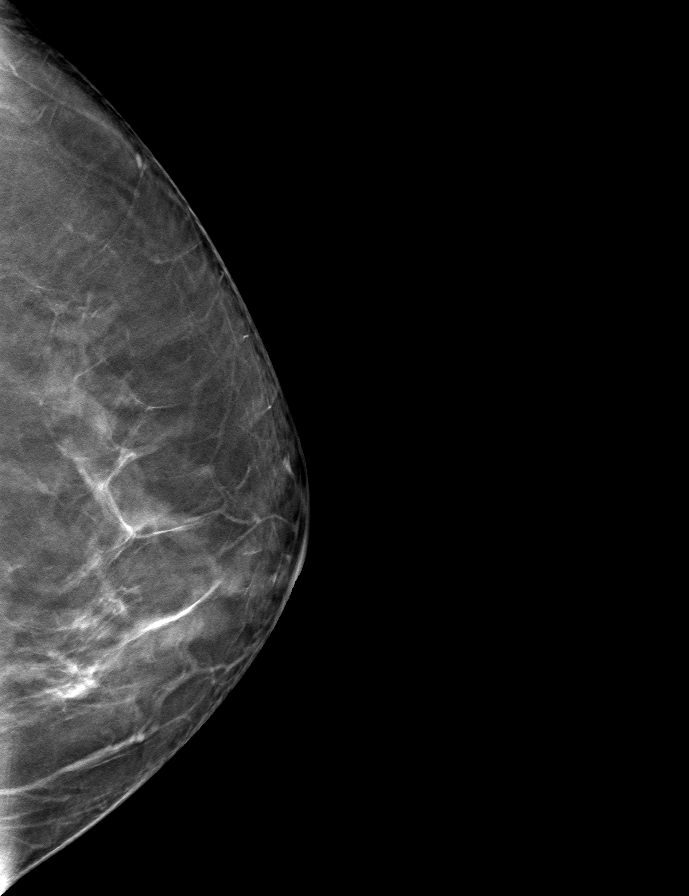

[R CC tomo · tomo slice 44/87.0]
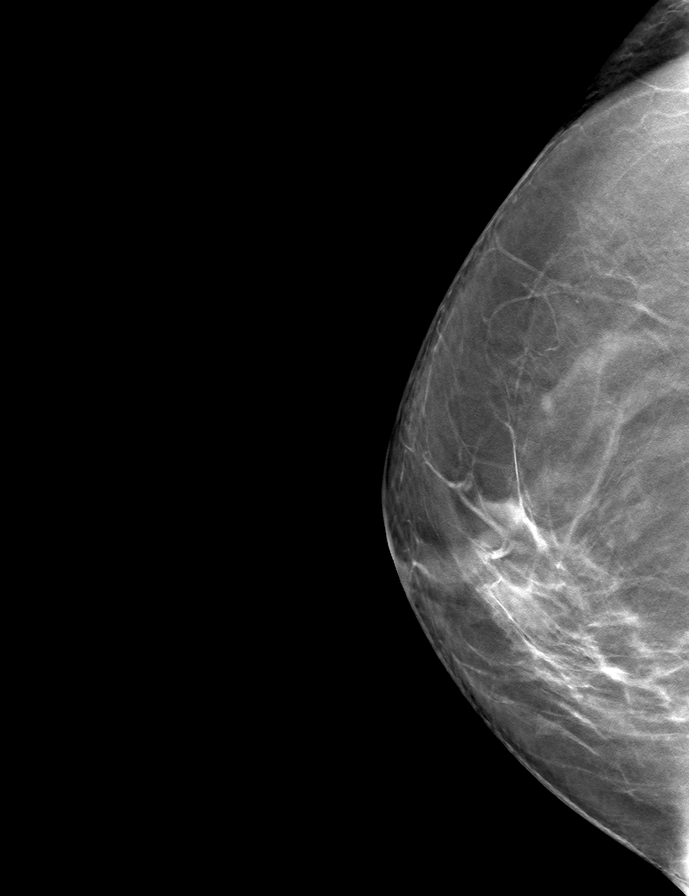

[L MLO tomo · tomo slice 43/85.0]
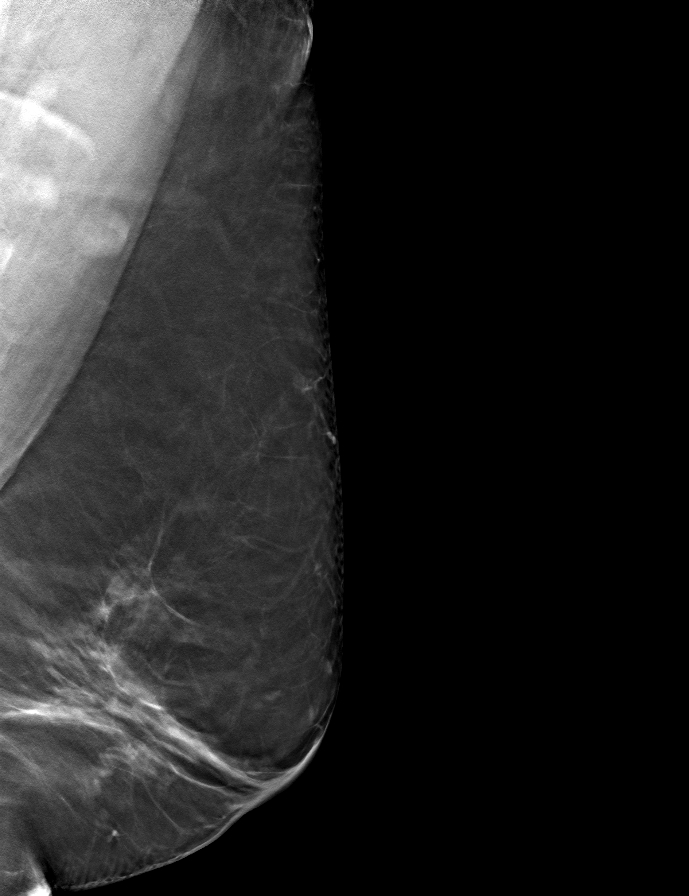

[9 of 24 positions shown; findings below may reference images not displayed]

ACR Breast Density Category b: There are scattered areas of
fibroglandular density.
FINDINGS: No concerning masses, calcifications or distortion identified within
either breast.

On physical exam, dense tissue is palpated lateral left breast.

Targeted ultrasound is performed, showing dense tissue without
suspicious mass within the outer and upper outer left breast at the
site of tenderness.
IMPRESSION: No mammographic evidence for malignancy. No suspicious abnormality
left breast at the site of tenderness.

RECOMMENDATION:
Screening mammogram in one year.(Code:2R-D-KJ2).

Continued clinical evaluation for left breast tenderness.

I have discussed the findings and recommendations with the patient.
If applicable, a reminder letter will be sent to the patient
regarding the next appointment.

BI-RADS CATEGORY  2: Benign.

## 2022-06-24 ENCOUNTER — Other Ambulatory Visit: Payer: Self-pay

## 2022-06-24 DIAGNOSIS — Z1231 Encounter for screening mammogram for malignant neoplasm of breast: Secondary | ICD-10-CM

## 2022-06-29 ENCOUNTER — Ambulatory Visit: Payer: Self-pay | Attending: Hematology and Oncology | Admitting: Hematology and Oncology

## 2022-06-29 ENCOUNTER — Ambulatory Visit
Admission: RE | Admit: 2022-06-29 | Discharge: 2022-06-29 | Disposition: A | Discharge status: Home / Self Care | Payer: Self-pay | Source: Ambulatory Visit | Attending: Obstetrics and Gynecology | Admitting: Obstetrics and Gynecology

## 2022-06-29 ENCOUNTER — Other Ambulatory Visit: Payer: Self-pay

## 2022-06-29 VITALS — BP 111/72 | Wt 190.3 lb

## 2022-06-29 DIAGNOSIS — Z1231 Encounter for screening mammogram for malignant neoplasm of breast: Secondary | ICD-10-CM

## 2022-06-29 DIAGNOSIS — Z1211 Encounter for screening for malignant neoplasm of colon: Secondary | ICD-10-CM

## 2022-06-29 NOTE — Progress Notes (Signed)
Marissa Mcdaniel is a 66 y.o. female who presents to [facility] clinic today with no complaints.    Pap Smear: Pap not smear completed today. Hysterectomy in 2001 for benign fibroids with no history of abnormal pap smears.    Physical exam: Breasts Breasts symmetrical. No skin abnormalities bilateral breasts. No nipple retraction bilateral breasts. No nipple discharge bilateral breasts. No lymphadenopathy. No lumps palpated bilateral breasts.     MS DIGITAL DIAG TOMO BILAT  Result Date: 06/23/2021 CLINICAL DATA:  Patient presents for left lateral breast pain. EXAM: DIGITAL DIAGNOSTIC BILATERAL MAMMOGRAM WITH TOMOSYNTHESIS AND CAD; ULTRASOUND LEFT BREAST LIMITED TECHNIQUE: Bilateral digital diagnostic mammography and breast tomosynthesis was performed. The images were evaluated with computer-aided detection.; Targeted ultrasound examination of the left breast was performed. COMPARISON:  Previous exam(s). ACR Breast Density Category b: There are scattered areas of fibroglandular density. FINDINGS: No concerning masses, calcifications or distortion identified within either breast. On physical exam, dense tissue is palpated lateral left breast. Targeted ultrasound is performed, showing dense tissue without suspicious mass within the outer and upper outer left breast at the site of tenderness. IMPRESSION: No mammographic evidence for malignancy. No suspicious abnormality left breast at the site of tenderness. RECOMMENDATION: Screening mammogram in one year.(Code:[id number]-01Y). Continued clinical evaluation for left breast tenderness. I have discussed the findings and recommendations with the patient. If applicable, a reminder letter will be sent to the patient regarding the next appointment. BI-RADS CATEGORY  2: Benign. Electronically Signed   By: Lovey Newcomer M.D.   On: 06/23/2021 10:06  MM 3D SCREEN BREAST BILATERAL  Result Date: 04/14/2020 CLINICAL DATA:  Screening. EXAM: DIGITAL SCREENING BILATERAL MAMMOGRAM  WITH TOMO AND CAD COMPARISON:  Previous exam(s). ACR Breast Density Category b: There are scattered areas of fibroglandular density. FINDINGS: There are no findings suspicious for malignancy. Images were processed with CAD. IMPRESSION: No mammographic evidence of malignancy. A result letter of this screening mammogram will be mailed directly to the patient. RECOMMENDATION: Screening mammogram in one year. (Code:[id number]) BI-RADS CATEGORY  1: Negative. Electronically Signed   By: Ammie Ferrier M.D.   On: 04/14/2020 12:50   MM 3D SCREEN BREAST BILATERAL  Result Date: 04/04/2019 CLINICAL DATA:  Screening. EXAM: DIGITAL SCREENING BILATERAL MAMMOGRAM WITH TOMO AND CAD COMPARISON:  Previous exam(s). ACR Breast Density Category b: There are scattered areas of fibroglandular density. FINDINGS: There are no findings suspicious for malignancy. Images were processed with CAD. IMPRESSION: No mammographic evidence of malignancy. A result letter of this screening mammogram will be mailed directly to the patient. RECOMMENDATION: Screening mammogram in one year. (Code:[id number]-01Y) BI-RADS CATEGORY  1: Negative. Electronically Signed   By: Audie Pinto M.D.   On: 04/04/2019 16:42   MM SCREENING BREAST TOMO BILATERAL  Result Date: 12/06/2017 CLINICAL DATA:  Screening. EXAM: DIGITAL SCREENING BILATERAL MAMMOGRAM WITH TOMO AND CAD COMPARISON:  Previous exam(s). ACR Breast Density Category b: There are scattered areas of fibroglandular density. FINDINGS: There are no findings suspicious for malignancy. Images were processed with CAD. IMPRESSION: No mammographic evidence of malignancy. A result letter of this screening mammogram will be mailed directly to the patient. RECOMMENDATION: Screening mammogram in one year. (Code:SM-B-01Y) BI-RADS CATEGORY  1: Negative. Electronically Signed   By: Fidela Salisbury M.D.   On: 12/06/2017 16:26      Pelvic/Bimanual Pap is not indicated today    Smoking History: Patient has  never smoked and was not referred to quit line.    Patient Navigation: Patient education provided. Access  to services provided for patient through [facility] program. No interpreter provided. No transportation provided   Colorectal Cancer Screening: Per patient has had colonoscopy completed on 2020 with benign findings. Family history of colon cancer.  No complaints today.    Breast and Cervical Cancer Risk Assessment: Patient does not have family history of breast cancer, known genetic mutations, or radiation treatment to the chest before age 73. Patient does not have history of cervical dysplasia, immunocompromised, or DES exposure in-utero.  Risk Assessment     Risk Scores       06/29/2022 06/23/2021   Last edited by: Demetrius Revel, LPN Velna Hatchet, CMA   5-year risk: 1.3 % 1.2 %   Lifetime risk: 4.8 % 4.9 %            A: [facility] exam without pap smear No complaints with benign exam.   P: Referred patient to the [facility] for a screening mammogram. Appointment scheduled 06/29/2022.  Melodye Ped, NP 06/29/2022 9:58 AM

## 2022-06-29 NOTE — Patient Instructions (Signed)
[place] about self breast awareness. Patient did not need a Pap smear today due to hysterectomy in 2001. Referred patient to the [place] for diagnostic mammogram. Appointment scheduled for 06/29/22. Patient aware of appointment and will be there. Let patient know will follow up with her within the next couple weeks with results. Montine Circle verbalized understanding. She will return to clinic in one year for annual mammogram.   Melodye Ped, NP 10:22 AM

## 2022-07-02 LAB — FECAL OCCULT BLOOD, IMMUNOCHEMICAL: Fecal Occult Bld: NEGATIVE

## 2022-07-07 ENCOUNTER — Telehealth: Payer: Self-pay

## 2022-07-07 NOTE — Telephone Encounter (Signed)
Patient informed negative FIT test results, verbalized understanding.  

## 2023-06-06 ENCOUNTER — Other Ambulatory Visit: Payer: Self-pay | Admitting: Family Medicine

## 2023-06-06 DIAGNOSIS — Z1231 Encounter for screening mammogram for malignant neoplasm of breast: Secondary | ICD-10-CM

## 2023-06-21 ENCOUNTER — Ambulatory Visit
Admission: RE | Admit: 2023-06-21 | Discharge: 2023-06-21 | Disposition: A | Discharge status: Home / Self Care | Payer: Medicare HMO | Source: Ambulatory Visit | Attending: Family Medicine | Admitting: Family Medicine

## 2023-06-21 DIAGNOSIS — Z1231 Encounter for screening mammogram for malignant neoplasm of breast: Secondary | ICD-10-CM | POA: Insufficient documentation

## 2023-09-17 ENCOUNTER — Ambulatory Visit
Admission: EM | Admit: 2023-09-17 | Discharge: 2023-09-17 | Disposition: A | Discharge status: Home / Self Care | Payer: PPO | Attending: Emergency Medicine | Admitting: Emergency Medicine

## 2023-09-17 DIAGNOSIS — J069 Acute upper respiratory infection, unspecified: Secondary | ICD-10-CM

## 2023-09-17 MED ORDER — AZITHROMYCIN 250 MG PO TABS: 250.0000 mg | ORAL_TABLET | Freq: Every day | ORAL | 0 refills | Status: DC

## 2023-09-17 NOTE — ED Triage Notes (Signed)
Cough, headache x 1 week. Taking mucinex, and honey.

## 2023-09-17 NOTE — ED Provider Notes (Signed)
Renal[facility] Fiddler    CSN: [id number] Arrival date & time: 09/17/23  1459      History   Chief Complaint Chief Complaint  Patient presents with   Cough    HPI Marissa Mcdaniel is a 68 y.o. female.  Patient presents with 1 week history of congestion, postnasal drip, headache, cough.  She has been treating her symptoms with Mucinex.  She denies fever or shortness of breath. The history is provided by the patient and medical records.    Past Medical History:  Diagnosis Date   Borderline diabetic    Lumbar radiculopathy    Menopause    Overweight    Rotator cuff syndrome of right shoulder     There are no active problems to display for this patient.   Past Surgical History:  Procedure Laterality Date   ABDOMINAL HYSTERECTOMY     COLONOSCOPY WITH PROPOFOL     COLONOSCOPY WITH PROPOFOL N/A 07/04/2019   Procedure: COLONOSCOPY WITH PROPOFOL;  Surgeon: Toledo, Boykin Nearing, MD;  Location: [facility] ENDOSCOPY;  Service: Endoscopy;  Laterality: N/A;   OOPHORECTOMY      OB History   No obstetric history on file.      Home Medications    Prior to Admission medications   Medication Sig Start Date End Date Taking? Authorizing Provider  azithromycin (ZITHROMAX) 250 MG tablet Take 1 tablet (250 mg total) by mouth daily. Take first 2 tablets together, then 1 every day until finished. 09/17/23  Yes Mickie Bail, NP  Calcium Carbonate-Vitamin D (CALTRATE 600+D PO) Take by mouth.   Yes [provider]  escitalopram (LEXAPRO) 5 MG tablet Take 5 mg by mouth daily. Patient not taking: Reported on 09/17/2023    [provider]  lidocaine (XYLOCAINE) 2 % solution Use as directed 15 mLs in the mouth or throat every 4 (four) hours as needed (throat pain). 03/01/15   Governor Rooks, MD  traMADol (ULTRAM) 50 MG tablet Take by mouth every 6 (six) hours as needed.    [provider]    Family History Family History  Problem Relation Age of Onset   Diabetes Father     Hypertension Father    Colon cancer Father    Breast cancer Neg Hx     Social History Social History   Tobacco Use   Smoking status: Former    Types: Cigarettes   Smokeless tobacco: Never  Vaping Use   Vaping status: Never Used  Substance Use Topics   Alcohol use: Yes    Alcohol/week: 1.0 standard drink of alcohol    Types: 1 Glasses of wine per week   Drug use: No     Allergies   Lodine [etodolac]   Review of Systems Review of Systems  Constitutional:  Negative for chills and fever.  HENT:  Positive for congestion, postnasal drip and sore throat. Negative for ear pain.   Respiratory:  Positive for cough. Negative for shortness of breath.   Neurological:  Positive for headaches.     Physical Exam Triage Vital Signs ED Triage Vitals  Encounter Vitals Group     BP 09/17/23 1531 127/77     Systolic BP Percentile --      Diastolic BP Percentile --      Pulse Rate 09/17/23 1531 86     Resp 09/17/23 1531 18     Temp 09/17/23 1531 99 F (37.2 C)     Temp src --      SpO2 09/17/23  1531 96 %     Weight --      Height --      Head Circumference --      Peak Flow --      Pain Score 09/17/23 1533 0     Pain Loc --      Pain Education --      Exclude from Growth Chart --    No data found.  Updated Vital Signs BP 127/77   Pulse 86   Temp 99 F (37.2 C)   Resp 18   SpO2 96%   Visual Acuity Right Eye Distance:   Left Eye Distance:   Bilateral Distance:    Right Eye Near:   Left Eye Near:    Bilateral Near:     Physical Exam Constitutional:      General: She is not in acute distress. HENT:     Right Ear: Tympanic membrane normal.     Left Ear: Tympanic membrane normal.     Nose: Congestion present.     Mouth/Throat:     Mouth: Mucous membranes are moist.     Pharynx: Oropharynx is clear.  Cardiovascular:     Rate and Rhythm: Normal rate and regular rhythm.     Heart sounds: Normal heart sounds.  Pulmonary:     Effort: Pulmonary effort is  normal. No respiratory distress.     Breath sounds: Normal breath sounds.  Neurological:     Mental Status: She is alert.      UC Treatments / Results  Labs (all labs ordered are listed, but only abnormal results are displayed) Labs Reviewed - No data to display  EKG   Radiology No results found.  Procedures Procedures (including critical care time)  Medications Ordered in UC Medications - No data to display  Initial Impression / Assessment and Plan / UC Course  I have reviewed the triage vital signs and the nursing notes.  Pertinent labs & imaging results that were available during my care of the patient were reviewed by me and considered in my medical decision making (see chart for details).   Acute upper respiratory infection.  Patient has been symptomatic for a week and is not improving with OTC treatment.  Treating today with Zithromax.  Tylenol as needed.  Plain Mucinex as needed.  Instructed patient to follow up with her PCP if her symptoms are not improving.  ED precautions given.  She agrees to plan of care.    Final Clinical Impressions(s) / UC Diagnoses   Final diagnoses:  Acute upper respiratory infection     Discharge Instructions      Take the Zithromax as directed.  Follow-up with your primary care provider if your symptoms are not improving.      ED Prescriptions     Medication Sig Dispense Auth. Provider   azithromycin (ZITHROMAX) 250 MG tablet Take 1 tablet (250 mg total) by mouth daily. Take first 2 tablets together, then 1 every day until finished. 6 tablet Mickie Bail, NP      PDMP not reviewed this encounter.   Mickie Bail, NP 09/17/23 [phone]

## 2023-09-17 NOTE — Discharge Instructions (Addendum)
Take the Zithromax as directed.  Follow up with your primary care provider if your symptoms are not improving.

## 2023-09-28 ENCOUNTER — Ambulatory Visit
Admission: RE | Admit: 2023-09-28 | Discharge: 2023-09-28 | Disposition: A | Discharge status: Home / Self Care | Payer: PPO | Source: Ambulatory Visit | Attending: Emergency Medicine | Admitting: Emergency Medicine

## 2023-09-28 VITALS — BP 106/61 | HR 77 | Temp 98.9°F | Resp 20 | Ht 65.0 in | Wt 190.0 lb

## 2023-09-28 DIAGNOSIS — J069 Acute upper respiratory infection, unspecified: Secondary | ICD-10-CM | POA: Diagnosis not present

## 2023-09-28 MED ORDER — AMOXICILLIN-POT CLAVULANATE 875-125 MG PO TABS: 1.0000 | ORAL_TABLET | Freq: Two times a day (BID) | ORAL | 0 refills | Status: DC

## 2023-09-28 MED ORDER — GUAIFENESIN-CODEINE 100-10 MG/5ML PO SOLN
5.0000 mL | Freq: Four times a day (QID) | ORAL | 0 refills | Status: DC | PRN
Start: 2023-09-28 — End: 2024-03-27

## 2023-09-28 MED ORDER — PREDNISONE 20 MG PO TABS: 40.0000 mg | ORAL_TABLET | Freq: Every day | ORAL | 0 refills | Status: DC

## 2023-09-28 NOTE — Discharge Instructions (Signed)
 Take Augmentin  every morning and every evening for 7 days  Begin prednisone  every morning with food for 5 days to relax the airway  May use guaifenesin  codeine  every 6 hours as needed for cough, be mindful this can make you sleepy    You can take Tylenol as needed for fever reduction and pain relief.   For cough: honey 1/2 to 1 teaspoon (you can dilute the honey in water or another fluid).  You can also use guaifenesin  and dextromethorphan for cough. You can use a humidifier for chest congestion and cough.  If you don't have a humidifier, you can sit in the bathroom with the hot shower running.      For sore throat: try warm salt water gargles, cepacol lozenges, throat spray, warm tea or water with lemon/honey, popsicles or ice, or OTC cold relief medicine for throat discomfort.   For congestion: take a daily anti-histamine like Zyrtec, Claritin, and a oral decongestant, such as pseudoephedrine.  You can also use Flonase 1-2 sprays in each nostril daily.   It is important to stay hydrated: drink plenty of fluids (water, gatorade/powerade/pedialyte, juices, or teas) to keep your throat moisturized and help further relieve irritation/discomfort.

## 2023-09-28 NOTE — ED Provider Notes (Signed)
 [name]    CSN: [id number] Arrival date & time: 09/28/23  1706      History   Chief Complaint Chief Complaint  Patient presents with   Cough   Headache    HPI Marissa Mcdaniel is a 68 y.o. female.   Patient presents for evaluation of rhinorrhea, nonproductive cough, intermittent headaches and mild diarrhea present for 2 weeks.  Known sick contacts that she was at a large gathering prior to symptoms beginning.  Tolerating food and liquids.  Has attempted use of Robitussin, Mucinex  and additional over-the-counter medications without relief.  Was evaluated in this urgent care approximately 1 week ago, started on azithromycin , endorses some improvement with medication but after discontinuation symptoms worsened and persisted.  Past Medical History:  Diagnosis Date   Borderline diabetic    Lumbar radiculopathy    Menopause    Overweight    Rotator cuff syndrome of right shoulder     There are no active problems to display for this patient.   Past Surgical History:  Procedure Laterality Date   ABDOMINAL HYSTERECTOMY     COLONOSCOPY WITH PROPOFOL      COLONOSCOPY WITH PROPOFOL  N/A 07/04/2019   Procedure: COLONOSCOPY WITH PROPOFOL ;  Surgeon: Toledo, Ladell POUR, MD;  Location: [facility] ENDOSCOPY;  Service: Endoscopy;  Laterality: N/A;   OOPHORECTOMY      OB History   No obstetric history on file.      Home Medications    Prior to Admission medications   Medication Sig Start Date End Date Taking? Authorizing Provider  amoxicillin -clavulanate (AUGMENTIN ) 875-125 MG tablet Take 1 tablet by mouth every 12 (twelve) hours. 09/28/23  Yes Alaiza Yau R, NP  guaiFENesin -codeine  100-10 MG/5ML syrup Take 5 mLs by mouth every 6 (six) hours as needed for cough. 09/28/23  Yes Elick Aguilera R, NP  predniSONE  (DELTASONE ) 20 MG tablet Take 2 tablets (40 mg total) by mouth daily. 09/28/23  Yes Hinda Lindor R, NP  azithromycin  (ZITHROMAX ) 250 MG tablet Take 1 tablet (250 mg  total) by mouth daily. Take first 2 tablets together, then 1 every day until finished. 09/17/23   Corlis Burnard DEL, NP  Calcium Carbonate-Vitamin D (CALTRATE 600+D PO) Take by mouth.    [provider]  escitalopram (LEXAPRO) 5 MG tablet Take 5 mg by mouth daily. Patient not taking: Reported on 09/17/2023    [provider]  lidocaine  (XYLOCAINE ) 2 % solution Use as directed 15 mLs in the mouth or throat every 4 (four) hours as needed (throat pain). 03/01/15   Jacquetta Stabs, MD  traMADol (ULTRAM) 50 MG tablet Take by mouth every 6 (six) hours as needed.    [provider]    Family History Family History  Problem Relation Age of Onset   Diabetes Father    Hypertension Father    Colon cancer Father    Breast cancer Neg Hx     Social History Social History   Tobacco Use   Smoking status: Former    Types: Cigarettes   Smokeless tobacco: Never  Vaping Use   Vaping status: Never Used  Substance Use Topics   Alcohol use: Yes    Alcohol/week: 1.0 standard drink of alcohol    Types: 1 Glasses of wine per week   Drug use: No     Allergies   Lodine [etodolac]   Review of Systems Review of Systems   Physical Exam Triage Vital Signs ED Triage Vitals  Encounter Vitals Group  BP 09/28/23 1736 106/61     Systolic BP Percentile --      Diastolic BP Percentile --      Pulse Rate 09/28/23 1736 77     Resp 09/28/23 1736 20     Temp 09/28/23 1736 98.9 F (37.2 C)     Temp Source 09/28/23 1736 Oral     SpO2 09/28/23 1736 98 %     Weight 09/28/23 1731 190 lb (86.2 kg)     Height 09/28/23 1731 5' 5 (1.651 m)     Head Circumference --      Peak Flow --      Pain Score 09/28/23 1730 5     Pain Loc --      Pain Education --      Exclude from Growth Chart --    No data found.  Updated Vital Signs BP 106/61 (BP Location: Right Arm)   Pulse 77   Temp 98.9 F (37.2 C) (Oral)   Resp 20   Ht 5' 5 (1.651 m)   Wt 190 lb (86.2 kg)   SpO2 98%   BMI  31.62 kg/m   Visual Acuity Right Eye Distance:   Left Eye Distance:   Bilateral Distance:    Right Eye Near:   Left Eye Near:    Bilateral Near:     Physical Exam Constitutional:      Appearance: Normal appearance.  Eyes:     Extraocular Movements: Extraocular movements intact.  Cardiovascular:     Rate and Rhythm: Normal rate and regular rhythm.     Pulses: Normal pulses.     Heart sounds: Normal heart sounds.  Pulmonary:     Effort: Pulmonary effort is normal.     Breath sounds: Normal breath sounds.  Neurological:     Mental Status: She is alert and oriented to person, place, and time. Mental status is at baseline.      UC Treatments / Results  Labs (all labs ordered are listed, but only abnormal results are displayed) Labs Reviewed - No data to display  EKG   Radiology No results found.  Procedures Procedures (including critical care time)  Medications Ordered in UC Medications - No data to display  Initial Impression / Assessment and Plan / UC Course  I have reviewed the triage vital signs and the nursing notes.  Pertinent labs & imaging results that were available during my care of the patient were reviewed by me and considered in my medical decision making (see chart for details).  Acute URI  Patient is in no signs of distress nor toxic appearing.  Vital signs are stable.  Low suspicion for pneumonia, pneumothorax or bronchitis and therefore will defer imaging lungs clear to auscultation, O2 saturation 98% on room air, stable for outpatient management.  Prescribed Augmentin  as she is seeing improvement with antibiotics as well as prednisone  and guaifenesin  codeine , PDMP reviewed, low risk.May use additional over-the-counter medications as needed for supportive care.  May follow-up with urgent care as needed if symptoms persist or worsen.   Final Clinical Impressions(s) / UC Diagnoses   Final diagnoses:  Acute URI     Discharge Instructions       Take Augmentin  every morning and every evening for 7 days  Begin prednisone  every morning with food for 5 days to relax the airway  May use guaifenesin  codeine  every 6 hours as needed for cough, be mindful this can make you sleepy    You can take Tylenol  as needed for fever reduction and pain relief.   For cough: honey 1/2 to 1 teaspoon (you can dilute the honey in water or another fluid).  You can also use guaifenesin  and dextromethorphan for cough. You can use a humidifier for chest congestion and cough.  If you don't have a humidifier, you can sit in the bathroom with the hot shower running.      For sore throat: try warm salt water gargles, cepacol lozenges, throat spray, warm tea or water with lemon/honey, popsicles or ice, or OTC cold relief medicine for throat discomfort.   For congestion: take a daily anti-histamine like Zyrtec, Claritin, and a oral decongestant, such as pseudoephedrine.  You can also use Flonase 1-2 sprays in each nostril daily.   It is important to stay hydrated: drink plenty of fluids (water, gatorade/powerade/pedialyte, juices, or teas) to keep your throat moisturized and help further relieve irritation/discomfort.    ED Prescriptions     Medication Sig Dispense Auth. Provider   predniSONE  (DELTASONE ) 20 MG tablet Take 2 tablets (40 mg total) by mouth daily. 10 tablet Lyndy Russman R, NP   amoxicillin -clavulanate (AUGMENTIN ) 875-125 MG tablet Take 1 tablet by mouth every 12 (twelve) hours. 14 tablet Terran Klinke R, NP   guaiFENesin -codeine  100-10 MG/5ML syrup Take 5 mLs by mouth every 6 (six) hours as needed for cough. 120 mL Theophilus Walz, Shelba SAUNDERS, NP      I have reviewed the PDMP during this encounter.   Teresa Shelba SAUNDERS, [place] 09/28/23 [phone]

## 2023-09-28 NOTE — ED Triage Notes (Signed)
Patient states she was seen at urgent care about 2 weeks ago and has persistent dry cough and recurring headache.  Requesting Promethazine cough syrup or something with codeine.  Tried Robitussin, Mucinex and other OTC w/out relief

## 2023-10-10 DIAGNOSIS — N289 Disorder of kidney and ureter, unspecified: Secondary | ICD-10-CM | POA: Diagnosis not present

## 2023-10-10 DIAGNOSIS — R7303 Prediabetes: Secondary | ICD-10-CM | POA: Diagnosis not present

## 2023-10-10 DIAGNOSIS — E7800 Pure hypercholesterolemia, unspecified: Secondary | ICD-10-CM | POA: Diagnosis not present

## 2023-10-18 DIAGNOSIS — R7303 Prediabetes: Secondary | ICD-10-CM | POA: Diagnosis not present

## 2023-10-18 DIAGNOSIS — E6609 Other obesity due to excess calories: Secondary | ICD-10-CM | POA: Diagnosis not present

## 2023-10-18 DIAGNOSIS — M159 Polyosteoarthritis, unspecified: Secondary | ICD-10-CM | POA: Diagnosis not present

## 2023-10-18 DIAGNOSIS — E66811 Obesity, class 1: Secondary | ICD-10-CM | POA: Diagnosis not present

## 2023-10-18 DIAGNOSIS — Z136 Encounter for screening for cardiovascular disorders: Secondary | ICD-10-CM | POA: Diagnosis not present

## 2023-10-18 DIAGNOSIS — Z6833 Body mass index (BMI) 33.0-33.9, adult: Secondary | ICD-10-CM | POA: Diagnosis not present

## 2023-11-01 ENCOUNTER — Ambulatory Visit: Attending: Family Medicine

## 2023-11-01 DIAGNOSIS — M25662 Stiffness of left knee, not elsewhere classified: Secondary | ICD-10-CM | POA: Insufficient documentation

## 2023-11-01 DIAGNOSIS — M25562 Pain in left knee: Secondary | ICD-10-CM | POA: Diagnosis not present

## 2023-11-01 NOTE — Therapy (Signed)
 OUTPATIENT PHYSICAL THERAPY EVALUATION   Patient Name: Marissa Mcdaniel MRN: [id number] DOB:[birth year], 68 y.o., female Today's Date: 11/01/2023  END OF SESSION:  PT End of Session - 11/01/23 0821     Visit Number 1    Number of Visits 13    Date for PT Re-Evaluation 12/16/23    PT Start Time 0821    PT Stop Time 0915    PT Time Calculation (min) 54 min    Activity Tolerance Patient tolerated treatment well    Behavior During Therapy [facility] for tasks assessed/performed             Past Medical History:  Diagnosis Date   Borderline diabetic    Lumbar radiculopathy    Menopause    Overweight    Rotator cuff syndrome of right shoulder    Past Surgical History:  Procedure Laterality Date   ABDOMINAL HYSTERECTOMY     COLONOSCOPY WITH PROPOFOL     COLONOSCOPY WITH PROPOFOL N/A 07/04/2019   Procedure: COLONOSCOPY WITH PROPOFOL;  Surgeon: Toledo, Boykin Nearing, MD;  Location: [facility] ENDOSCOPY;  Service: Endoscopy;  Laterality: N/A;   OOPHORECTOMY     There are no active problems to display for this patient.   PCP: [facility]   REFERRING PROVIDER: Marisue Ivan, MD  REFERRING DIAG: [phone] (ICD-10-CM) - Pain in left knee  Rationale for Evaluation and Treatment: Rehabilitation  THERAPY DIAG:  Left knee pain, unspecified chronicity - Plan: PT plan of care cert/re-cert  Stiffness of left knee, not elsewhere classified - Plan: PT plan of care cert/re-cert  ONSET DATE:  a year ago.   SUBJECTIVE:                                                                                                                                                                                           SUBJECTIVE STATEMENT: L knee pain. 5/10 currently (pt sitting, 8/10 at worst for the past 3 months.   PERTINENT HISTORY:  L knee pain. Gradual onset, about a year ago. R knee hurts times. 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 Pain has not changed since a year ago.  Pt L knee is currently not bone on bone. [facility]    No blood pressure problems per pt.  No latex allergies   PAIN:  Are you having pain? Yes: NPRS scale: 5/10 Pain location: L medial and lateral knee joint Pain description: achy Aggravating factors: standing all day, stairs, standing up from sitting, squatting  Relieving factors: sitting, being in the pool.   PRECAUTIONS:  None  RED FLAGS: Bowel or bladder incontinence: No and Cauda equina syndrome: No   WEIGHT BEARING RESTRICTIONS: No  FALLS:  Has patient fallen in last 6 months? 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   PLOF: Independent  PATIENT GOALS: Figure out what kind of machines to work on in the gym to strengthen her knee.   NEXT MD VISIT: none yet.  OBJECTIVE:  Note: Objective measures were completed at Evaluation unless otherwise noted.  DIAGNOSTIC FINDINGS:    PATIENT SURVEYS:  LEFS 63/80 (11/01/2023)  COGNITION: Overall cognitive status: Within functional limits for tasks assessed     SENSATION: WFL  MUSCLE LENGTH:   POSTURE: B foot pronation, L > R, B knee genu valgus, decreased L knee extension, R anterior pelvic rotation  PALPATION: TTP L lateral knee joint line (at tibia) > medial joint line  LUMBAR ROM:   AROM eval  Flexion   Extension   Right lateral flexion   Left lateral flexion   Right rotation   Left rotation    (Blank rows = not tested)  LOWER EXTREMITY ROM:     Active  Right eval Left eval  Hip flexion    Hip extension    Hip abduction    Hip adduction    Hip internal rotation 15 (PROM) 37 (PROM)  Hip external rotation    Knee flexion    Knee extension 0 -6 degrees (supine  quad set position), with lateral knee pain  Ankle dorsiflexion    Ankle plantarflexion    Ankle inversion    Ankle eversion     (Blank rows = not tested)  LOWER EXTREMITY MMT:    MMT Right eval Left eval  Hip flexion 4 4  Hip extension 4- 4-  Hip abduction 4 4  Hip adduction    Hip internal rotation    Hip external rotation 4- 4  Knee flexion 4+ 4  Knee extension 5 5  Ankle dorsiflexion    Ankle plantarflexion    Ankle inversion    Ankle eversion     (Blank rows = not tested)    FUNCTIONAL TESTS:  (+) step down test L and R LE with reproduction of L knee pain.  (-) long sit test  GAIT: Distance walked: 60 ft Assistive device utilized: None Level of assistance: Complete Independence Comments: decreased stance L LE   TREATMENT DATE: 11/01/2023                                                                                                                               Therapeutic exercises  S/L hip abduction   L 10x2  R 10x2  Prone hip extendion, knee  bent with 2 pillows under abdomen  L 10x2  R 10x2    Improved exercise technique, movement at target joints, use of target muscles after mod verbal, visual, tactile cues.     PATIENT EDUCATION:  Education details: there-ex, HEP, POC Person educated: Patient Education method: Explanation, Demonstration, Tactile cues, Verbal cues, and Handouts Education comprehension: verbalized understanding and returned demonstration  HOME EXERCISE PROGRAM: Access Code: [email or web address] URL: [email or web address] Date: 11/01/2023 Prepared by: Loralyn Freshwater  Exercises - Sidelying Hip Abduction  - 1 x daily - 7 x weekly - 2 sets - 10 reps - Prone Hip Extension with Bent Knee - Two Pillows  - 1 x daily - 7 x weekly - 2 sets - 10 reps  ASSESSMENT:  CLINICAL IMPRESSION: Patient is a 68 y.o. female who was seen today for physical therapy evaluation and treatment for L knee pain. She also presents with altered gait  pattern and posture, decreased B femoral control, decreased L knee extension ROM, TTP L lateral knee > medial knee joint line, bilateral glute med and max weakness, and difficulty tolerating prolonged standing at work as well as difficulty with transfers and stair negotiation secondary to knee pain. Pt will benefit from skilled physical therapy services to address the aforementioned deficits.      OBJECTIVE IMPAIRMENTS: decreased ROM, decreased strength, improper body mechanics, postural dysfunction, and pain.   ACTIVITY LIMITATIONS: lifting, standing, squatting, stairs, transfers, and locomotion level  PARTICIPATION LIMITATIONS:   PERSONAL FACTORS: Age, Profession, and Time since onset of injury/illness/exacerbation are also affecting patient's functional outcome.   REHAB POTENTIAL: Fair    CLINICAL DECISION MAKING: Stable/uncomplicated  EVALUATION COMPLEXITY: Low   GOALS: Goals reviewed with patient? Yes  SHORT TERM GOALS: Target date: 11/11/2023  Pt will be independent with her initial HEP to decrease pain, improve strength, function, and ability to stand, perform transfers, and negotiate stairs more comfortably for her L knee.  Baseline: Pt has started her initial. HEP (11/01/2023) Goal status: INITIAL    LONG TERM GOALS: Target date: 12/16/2023  Pt will have a decrease in L knee pain to 3/10 or less at worst to promote ability to tolerate prolonged standing, transfers, and stair negotiation more comfortably for her knee.  Baseline: 8/10 at worst for the past 3 months (11/01/2023) Goal status: INITIAL  2.  Pt will improve her R and L hip extension, abduction, and ER by at least 1/2 MMT grade to promote ability to negotiate stairs, as well as transfers more comfortably for her knees.  Baseline:  MMT Right eval Left eval  Hip extension 4- 4-  Hip abduction 4 4  Hip external rotation 4- 4   Goal status: INITIAL  3.  Pt will improve her LEFS score by at least 10 points as a  demonstration of improved function.  Baseline:LEFS 63/80 (11/01/2023) Goal status: INITIAL    PLAN:  PT FREQUENCY: 1-2x/week  PT DURATION: 6 weeks  PLANNED INTERVENTIONS: 97110-Therapeutic exercises, 97530- Therapeutic activity, 97112- Neuromuscular re-education, 97535- Self Care, 16109- Manual therapy, [phone]- Aquatic Therapy, G0283- Electrical stimulation (unattended), [phone]- Ionotophoresis 4mg /ml Dexamethasone, Patient/Family education, Stair training, Taping, Dry Needling, Joint mobilization, Spinal manipulation, and Spinal mobilization.  PLAN FOR NEXT SESSION: trunk, glute med, max, hip ER strengthening, femoral control, manual techniques, modalities PRN.    Etha Stambaugh, PT, DPT 11/01/2023, 10:01 AM

## 2023-11-07 ENCOUNTER — Ambulatory Visit

## 2023-11-07 DIAGNOSIS — M25562 Pain in left knee: Secondary | ICD-10-CM

## 2023-11-07 DIAGNOSIS — M25662 Stiffness of left knee, not elsewhere classified: Secondary | ICD-10-CM

## 2023-11-07 NOTE — Therapy (Signed)
 OUTPATIENT PHYSICAL THERAPY TREATMENT   Patient Name: Marissa Mcdaniel MRN: [id number] DOB:12-04-1955, 68 y.o., female Today's Date: 11/07/2023  END OF SESSION:  PT End of Session - 11/07/23 4403     Visit Number 2    Number of Visits 13    Date for PT Re-Evaluation 12/16/23    PT Start Time 0822    PT Stop Time 0900    PT Time Calculation (min) 38 min    Activity Tolerance Patient tolerated treatment well    Behavior During Therapy [facility] for tasks assessed/performed              Past Medical History:  Diagnosis Date   Borderline diabetic    Lumbar radiculopathy    Menopause    Overweight    Rotator cuff syndrome of right shoulder    Past Surgical History:  Procedure Laterality Date   ABDOMINAL HYSTERECTOMY     COLONOSCOPY WITH PROPOFOL     COLONOSCOPY WITH PROPOFOL N/A 07/04/2019   Procedure: COLONOSCOPY WITH PROPOFOL;  Surgeon: Toledo, Boykin Nearing, MD;  Location: [facility] ENDOSCOPY;  Service: Endoscopy;  Laterality: N/A;   OOPHORECTOMY     There are no active problems to display for this patient.   PCP: [facility]   REFERRING PROVIDER: Marisue Ivan, MD  REFERRING DIAG: [phone] (ICD-10-CM) - Pain in left knee  Rationale for Evaluation and Treatment: Rehabilitation  THERAPY DIAG:  Left knee pain, unspecified chronicity  Stiffness of left knee, not elsewhere classified  ONSET DATE:  a year ago.   SUBJECTIVE:                                                                                                                                                                                           SUBJECTIVE STATEMENT: L knee is feeling ok today. L knee felt good after last session. Not really having pain currently.      PERTINENT HISTORY:  L knee pain. Gradual onset, about a year ago. R knee hurts times. 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 Pain has not changed since a year ago. Pt L knee is currently not bone on bone. [facility] [employer] [facility]    No blood pressure problems per pt.  No latex allergies   PAIN:  Are you having pain? Yes: NPRS scale: 5/10 Pain location: L medial and lateral knee joint Pain description: achy Aggravating factors: standing all day, stairs, standing up from sitting, squatting  Relieving factors: sitting, being in the pool.   PRECAUTIONS: None  RED FLAGS: Bowel or bladder  incontinence: No and Cauda equina syndrome: No   WEIGHT BEARING RESTRICTIONS: No  FALLS:  Has patient fallen in last 6 months? 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   PLOF: Independent  PATIENT GOALS: Figure out what kind of machines to work on in the gym to strengthen her knee.   NEXT MD VISIT: none yet.  OBJECTIVE:  Note: Objective measures were completed at Evaluation unless otherwise noted.  DIAGNOSTIC FINDINGS:    PATIENT SURVEYS:  LEFS 63/80 (11/01/2023)  COGNITION: Overall cognitive status: Within functional limits for tasks assessed     SENSATION: WFL  MUSCLE LENGTH:   POSTURE: B foot pronation, L > R, B knee genu valgus, decreased L knee extension, R anterior pelvic rotation  PALPATION: TTP L lateral knee joint line (at tibia) > medial joint line  LUMBAR ROM:   AROM eval  Flexion   Extension   Right lateral flexion   Left lateral flexion   Right rotation   Left rotation    (Blank rows = not tested)  LOWER EXTREMITY ROM:     Active  Right eval Left eval  Hip flexion    Hip extension    Hip abduction    Hip adduction    Hip internal rotation 15 (PROM) 37 (PROM)  Hip external rotation    Knee flexion    Knee extension 0 -6 degrees (supine quad set position), with lateral knee pain   Ankle dorsiflexion    Ankle plantarflexion    Ankle inversion    Ankle eversion     (Blank rows = not tested)  LOWER EXTREMITY MMT:    MMT Right eval Left eval  Hip flexion 4 4  Hip extension 4- 4-  Hip abduction 4 4  Hip adduction    Hip internal rotation    Hip external rotation 4- 4  Knee flexion 4+ 4  Knee extension 5 5  Ankle dorsiflexion    Ankle plantarflexion    Ankle inversion    Ankle eversion     (Blank rows = not tested)    FUNCTIONAL TESTS:  (+) step down test L and R LE with reproduction of L knee pain.  (-) long sit test  GAIT: Distance walked: 60 ft Assistive device utilized: None Level of assistance: Complete Independence Comments: decreased stance L LE   TREATMENT DATE: 11/07/2023                                                                                                                               Therapeutic exercises  S/L hip abduction   L 10x2  R 10x2  Prone hip extension, knee bent with 2 pillows under abdomen  L 10x2  R 10x2  Seated L knee flexion targeting the medial hamstrings   Red band 10x3  Seated hip ER L 10x5 seconds  Seated hip adduction isometrics small blue ball squeeze 10x5 seconds  Supine quad set 10x5 seconds    Improved exercise technique, movement at target joints, use of target muscles after mod verbal, visual, tactile cues.   Manual therapy  Supine with L leg propped onto pillow STM L lateral hamstrings to decrease tension and promote knee extension     PATIENT EDUCATION:  Education details: there-ex, HEP, POC Person educated: Patient Education method: Explanation, Demonstration, Tactile cues, Verbal cues, and Handouts Education comprehension: verbalized understanding and returned demonstration  HOME EXERCISE PROGRAM: Access Code: [email or web address] URL: [email or web address] Date: 11/01/2023 Prepared by: Loralyn Freshwater  Exercises - Sidelying Hip Abduction  - 1 x daily - 7 x weekly - 2  sets - 10 reps - Prone Hip Extension with Bent Knee - Two Pillows  - 1 x daily - 7 x weekly - 2 sets - 10 reps  ASSESSMENT:  CLINICAL IMPRESSION: Continued working on improving glute med and max strength to promote femoral control and better L knee joint mechanics to decrease stress to affected areas. Pt tolerated session well without aggravation of symptoms. Good muscle use felt during exercises. Knee feels better after session reported. Pt will benefit from continued skilled physical therapy services to decrease pain, improve strength and function.         OBJECTIVE IMPAIRMENTS: decreased ROM, decreased strength, improper body mechanics, postural dysfunction, and pain.   ACTIVITY LIMITATIONS: lifting, standing, squatting, stairs, transfers, and locomotion level  PARTICIPATION LIMITATIONS:   PERSONAL FACTORS: Age, Profession, and Time since onset of injury/illness/exacerbation are also affecting patient's functional outcome.   REHAB POTENTIAL: Fair    CLINICAL DECISION MAKING: Stable/uncomplicated  EVALUATION COMPLEXITY: Low   GOALS: Goals reviewed with patient? Yes  SHORT TERM GOALS: Target date: 11/11/2023  Pt will be independent with her initial HEP to decrease pain, improve strength, function, and ability to stand, perform transfers, and negotiate stairs more comfortably for her L knee.  Baseline: Pt has started her initial. HEP (11/01/2023) Goal status: INITIAL    LONG TERM GOALS: Target date: 12/16/2023  Pt will have a decrease in L knee pain to 3/10 or less at worst to promote ability to tolerate prolonged standing, transfers, and stair negotiation more comfortably for her knee.  Baseline: 8/10 at worst for the past 3 months (11/01/2023) Goal status: INITIAL  2.  Pt will improve her R and L hip extension, abduction, and ER by at least 1/2 MMT grade to promote ability to negotiate stairs, as well as transfers more comfortably for her knees.  Baseline:  MMT  Right eval Left eval  Hip extension 4- 4-  Hip abduction 4 4  Hip external rotation 4- 4   Goal status: INITIAL  3.  Pt will improve her LEFS score by at least 10 points as a demonstration of improved function.  Baseline:LEFS 63/80 (11/01/2023) Goal status: INITIAL    PLAN:  PT FREQUENCY: 1-2x/week  PT DURATION: 6 weeks  PLANNED INTERVENTIONS: 97110-Therapeutic exercises, 97530- Therapeutic activity, 97112- Neuromuscular re-education, 97535- Self Care, 33295- Manual therapy, [phone]- Aquatic Therapy, G0283- Electrical stimulation (unattended), [phone]- Ionotophoresis 4mg /ml Dexamethasone, Patient/Family education, Stair training, Taping, Dry Needling, Joint mobilization, Spinal manipulation, and Spinal mobilization.  PLAN FOR NEXT SESSION: trunk, glute med, max, hip ER strengthening, femoral control, manual techniques, modalities PRN.    Kadija Cruzen, PT, DPT  11/07/2023, 12:46 PM

## 2023-11-09 ENCOUNTER — Ambulatory Visit

## 2023-11-14 ENCOUNTER — Ambulatory Visit

## 2023-11-14 ENCOUNTER — Telehealth: Payer: Self-pay

## 2023-11-14 NOTE — Telephone Encounter (Signed)
 No show. Called patient who said that she did not know she had an appointment today. Will call back around 10:45 am this morning to check with the schedule.

## 2023-11-16 ENCOUNTER — Ambulatory Visit

## 2023-11-16 ENCOUNTER — Telehealth: Payer: Self-pay

## 2023-11-16 NOTE — Telephone Encounter (Signed)
 Called patient stating that appointments were available at another therapist's schedule for Monday and Tuesdays and someone from the front desk should be calling her back for the scheduling. Return phone call requested to let us know that she received the message.

## 2023-11-22 ENCOUNTER — Encounter: Admitting: Physical Therapy

## 2023-11-24 ENCOUNTER — Encounter: Admitting: Physical Therapy

## 2023-11-28 ENCOUNTER — Encounter: Admitting: Physical Therapy

## 2023-11-29 DIAGNOSIS — M25562 Pain in left knee: Secondary | ICD-10-CM | POA: Diagnosis not present

## 2023-12-01 ENCOUNTER — Encounter: Admitting: Physical Therapy

## 2023-12-06 ENCOUNTER — Encounter: Admitting: Physical Therapy

## 2023-12-06 DIAGNOSIS — M25562 Pain in left knee: Secondary | ICD-10-CM | POA: Diagnosis not present

## 2023-12-08 ENCOUNTER — Encounter: Admitting: Physical Therapy

## 2023-12-12 ENCOUNTER — Encounter: Admitting: Physical Therapy

## 2023-12-12 DIAGNOSIS — M25562 Pain in left knee: Secondary | ICD-10-CM | POA: Diagnosis not present

## 2023-12-15 ENCOUNTER — Encounter: Admitting: Physical Therapy

## 2023-12-19 DIAGNOSIS — M2042 Other hammer toe(s) (acquired), left foot: Secondary | ICD-10-CM | POA: Diagnosis not present

## 2023-12-19 DIAGNOSIS — M2141 Flat foot [pes planus] (acquired), right foot: Secondary | ICD-10-CM | POA: Diagnosis not present

## 2023-12-19 DIAGNOSIS — M2041 Other hammer toe(s) (acquired), right foot: Secondary | ICD-10-CM | POA: Diagnosis not present

## 2023-12-19 DIAGNOSIS — L84 Corns and callosities: Secondary | ICD-10-CM | POA: Diagnosis not present

## 2023-12-19 DIAGNOSIS — M249 Joint derangement, unspecified: Secondary | ICD-10-CM | POA: Diagnosis not present

## 2023-12-19 DIAGNOSIS — M2011 Hallux valgus (acquired), right foot: Secondary | ICD-10-CM | POA: Diagnosis not present

## 2023-12-19 DIAGNOSIS — M79671 Pain in right foot: Secondary | ICD-10-CM | POA: Diagnosis not present

## 2023-12-19 DIAGNOSIS — M2012 Hallux valgus (acquired), left foot: Secondary | ICD-10-CM | POA: Diagnosis not present

## 2023-12-19 DIAGNOSIS — M79672 Pain in left foot: Secondary | ICD-10-CM | POA: Diagnosis not present

## 2023-12-19 DIAGNOSIS — L603 Nail dystrophy: Secondary | ICD-10-CM | POA: Diagnosis not present

## 2023-12-19 DIAGNOSIS — M2142 Flat foot [pes planus] (acquired), left foot: Secondary | ICD-10-CM | POA: Diagnosis not present

## 2023-12-20 DIAGNOSIS — M159 Polyosteoarthritis, unspecified: Secondary | ICD-10-CM | POA: Diagnosis not present

## 2023-12-20 DIAGNOSIS — M6530 Trigger finger, unspecified finger: Secondary | ICD-10-CM | POA: Diagnosis not present

## 2023-12-21 ENCOUNTER — Encounter

## 2023-12-27 ENCOUNTER — Encounter

## 2023-12-29 ENCOUNTER — Encounter

## 2023-12-30 ENCOUNTER — Ambulatory Visit
Admission: EM | Admit: 2023-12-30 | Discharge: 2023-12-30 | Disposition: A | Discharge status: Home / Self Care | Attending: Emergency Medicine | Admitting: Emergency Medicine

## 2023-12-30 DIAGNOSIS — R21 Rash and other nonspecific skin eruption: Secondary | ICD-10-CM | POA: Diagnosis not present

## 2023-12-30 MED ORDER — HYDROXYZINE HCL 25 MG PO TABS: 25.0000 mg | ORAL_TABLET | Freq: Four times a day (QID) | ORAL | 0 refills | Status: AC

## 2023-12-30 MED ORDER — PREDNISONE 10 MG (21) PO TBPK: ORAL_TABLET | Freq: Every day | ORAL | 0 refills | Status: DC

## 2023-12-30 MED ORDER — DEXAMETHASONE SODIUM PHOSPHATE 10 MG/ML IJ SOLN
10.0000 mg | Freq: Once | INTRAMUSCULAR | Status: AC
  Administered 2023-12-30: 10 mg via INTRAMUSCULAR

## 2023-12-30 NOTE — ED Provider Notes (Signed)
 Arlander Bellman    CSN: [id number] Arrival date & time: 12/30/23  1915      History   Chief Complaint Chief Complaint  Patient presents with   Rash    HPI Marissa Mcdaniel is a 68 y.o. female.   Patient presents for evaluation of a pruritic rash present to the chest and bilateral upper extremities present for 4 weeks.  Darted abruptly without unknown cause, denies changes in toiletries, diet, medications or recent travel.  Has been applying cortisone which has been helpful.  Denies drainage or fever.  Past Medical History:  Diagnosis Date   Borderline diabetic    Lumbar radiculopathy    Menopause    Overweight    Rotator cuff syndrome of right shoulder     There are no active problems to display for this patient.   Past Surgical History:  Procedure Laterality Date   ABDOMINAL HYSTERECTOMY     COLONOSCOPY WITH PROPOFOL      COLONOSCOPY WITH PROPOFOL  N/A 07/04/2019   Procedure: COLONOSCOPY WITH PROPOFOL ;  Surgeon: Toledo, Alphonsus Jeans, MD;  Location: [facility] ENDOSCOPY;  Service: Endoscopy;  Laterality: N/A;   OOPHORECTOMY      OB History   No obstetric history on file.      Home Medications    Prior to Admission medications   Medication Sig Start Date End Date Taking? Authorizing Provider  hydrOXYzine (ATARAX) 25 MG tablet Take 1 tablet (25 mg total) by mouth every 6 (six) hours. 12/30/23  Yes Cindy Brindisi R, NP  predniSONE  (STERAPRED UNI-PAK 21 TAB) 10 MG (21) TBPK tablet Take by mouth daily. Take 6 tabs by mouth daily  for 2 days, then 5 tabs for 2 days, then 4 tabs for 2 days, then 3 tabs for 2 days, 2 tabs for 2 days, then 1 tab by mouth daily for 2 days 12/30/23  Yes Hance Caspers R, NP  amoxicillin -clavulanate (AUGMENTIN ) 875-125 MG tablet Take 1 tablet by mouth every 12 (twelve) hours. 09/28/23   Reena Canning, NP  azithromycin  (ZITHROMAX ) 250 MG tablet Take 1 tablet (250 mg total) by mouth daily. Take first 2 tablets together, then 1 every day until  finished. 09/17/23   Wellington Half, NP  Calcium Carbonate-Vitamin D (CALTRATE 600+D PO) Take by mouth.    [provider]  escitalopram (LEXAPRO) 5 MG tablet Take 5 mg by mouth daily. Patient not taking: Reported on 09/17/2023    [provider]  guaiFENesin -codeine  100-10 MG/5ML syrup Take 5 mLs by mouth every 6 (six) hours as needed for cough. 09/28/23   Averi Kilty, Maybelle Spatz, NP  lidocaine  (XYLOCAINE ) 2 % solution Use as directed 15 mLs in the mouth or throat every 4 (four) hours as needed (throat pain). 03/01/15   Ainsley Houston, MD  traMADol (ULTRAM) 50 MG tablet Take by mouth every 6 (six) hours as needed.    [provider]    Family History Family History  Problem Relation Age of Onset   Diabetes Father    Hypertension Father    Colon cancer Father    Breast cancer Neg Hx     Social History Social History   Tobacco Use   Smoking status: Former    Types: Cigarettes   Smokeless tobacco: Never  Vaping Use   Vaping status: Never Used  Substance Use Topics   Alcohol use: Yes    Alcohol/week: 1.0 standard drink of alcohol    Types: 1 Glasses of wine per week   Drug  use: No     Allergies   Lodine [etodolac]   Review of Systems Review of Systems  Skin:  Positive for rash.     Physical Exam Triage Vital Signs ED Triage Vitals  Encounter Vitals Group     BP 12/30/23 1920 107/85     Systolic BP Percentile --      Diastolic BP Percentile --      Pulse Rate 12/30/23 1920 93     Resp 12/30/23 1920 16     Temp 12/30/23 1920 97.6 F (36.4 C)     Temp Source 12/30/23 1920 Temporal     SpO2 12/30/23 1920 96 %     Weight --      Height --      Head Circumference --      Peak Flow --      Pain Score 12/30/23 1919 0     Pain Loc --      Pain Education --      Exclude from Growth Chart --    No data found.  Updated Vital Signs BP 107/85 (BP Location: Left Arm)   Pulse 93   Temp 97.6 F (36.4 C) (Temporal)   Resp 16   SpO2 96%   Visual  Acuity Right Eye Distance:   Left Eye Distance:   Bilateral Distance:    Right Eye Near:   Left Eye Near:    Bilateral Near:     Physical Exam Constitutional:      Appearance: Normal appearance.  Eyes:     Extraocular Movements: Extraocular movements intact.  Pulmonary:     Effort: Pulmonary effort is normal.  Skin:    Comments: Flesh tone papular rash present over the trunk and bilateral upper extremities  Neurological:     Mental Status: She is alert and oriented to person, place, and time. Mental status is at baseline.      UC Treatments / Results  Labs (all labs ordered are listed, but only abnormal results are displayed) Labs Reviewed - No data to display  EKG   Radiology No results found.  Procedures Procedures (including critical care time)  Medications Ordered in UC Medications  dexamethasone (DECADRON) injection 10 mg (10 mg Intramuscular Given 12/30/23 1934)    Initial Impression / Assessment and Plan / UC Course  I have reviewed the triage vital signs and the nursing notes.  Pertinent labs & imaging results that were available during my care of the patient were reviewed by me and considered in my medical decision making (see chart for details).  Rash  Appears inflammatory, no signs of infection, discussed with patient, unknown etiology, responding to topical cortisone but is still persisting therefore will attempt use of oral prednisone , Decadron IM given and prescribe hydroxyzine for management of itching, recommended supportive care and advised follow-up if symptoms continue to persist, given walker referral to dermatology and allergist Final Clinical Impressions(s) / UC Diagnoses   Final diagnoses:  Rash and nonspecific skin eruption   Discharge Instructions      Today you are being treated for inflammatory rash, at this time with no signs of infection, unknown cause  You have been given an injection of steroids today in the office today to  help reduce the inflammatory process that occurs with this rash which will help minimize your itching as well as begin to clear  Starting tomorrow take prednisone  every morning with food as directed, to continue the above process  You may continue use of  topical calamine or Benadryl cream to help manage itching, you may also continue oral Benadryl  Please avoid long exposures to heat such as a hot steamy shower or being outside as this may cause further irritation to your rash  You may follow-up with dermatology or the allergist if symptoms continue to persist or recur  ED Prescriptions     Medication Sig Dispense Auth. Provider   predniSONE  (STERAPRED UNI-PAK 21 TAB) 10 MG (21) TBPK tablet Take by mouth daily. Take 6 tabs by mouth daily  for 2 days, then 5 tabs for 2 days, then 4 tabs for 2 days, then 3 tabs for 2 days, 2 tabs for 2 days, then 1 tab by mouth daily for 2 days 42 tablet Hawthorne Day R, NP   hydrOXYzine (ATARAX) 25 MG tablet Take 1 tablet (25 mg total) by mouth every 6 (six) hours. 12 tablet Joandry Slagter R, NP      PDMP not reviewed this encounter.   Reena Canning, NP 12/30/23 [phone]

## 2023-12-30 NOTE — ED Triage Notes (Signed)
 Patient presents to UC for rash x 4 weeks on chest and upper extremities.

## 2023-12-30 NOTE — Discharge Instructions (Signed)
 Today you are being treated for inflammatory rash, at this time with no signs of infection, unknown cause  You have been given an injection of steroids today in the office today to help reduce the inflammatory process that occurs with this rash which will help minimize your itching as well as begin to clear  Starting tomorrow take prednisone  every morning with food as directed, to continue the above process  You may continue use of topical calamine or Benadryl cream to help manage itching, you may also continue oral Benadryl  Please avoid long exposures to heat such as a hot steamy shower or being outside as this may cause further irritation to your rash  You may follow-up with dermatology or the allergist if symptoms continue to persist or recur

## 2023-12-31 ENCOUNTER — Ambulatory Visit: Payer: Self-pay

## 2024-01-02 ENCOUNTER — Encounter

## 2024-01-04 ENCOUNTER — Encounter

## 2024-02-01 DIAGNOSIS — R7303 Prediabetes: Secondary | ICD-10-CM | POA: Diagnosis not present

## 2024-02-01 DIAGNOSIS — R21 Rash and other nonspecific skin eruption: Secondary | ICD-10-CM | POA: Diagnosis not present

## 2024-03-22 ENCOUNTER — Ambulatory Visit

## 2024-03-27 ENCOUNTER — Ambulatory Visit
Admission: EM | Admit: 2024-03-27 | Discharge: 2024-03-27 | Disposition: A | Discharge status: Home / Self Care | Attending: Emergency Medicine | Admitting: Emergency Medicine

## 2024-03-27 DIAGNOSIS — J069 Acute upper respiratory infection, unspecified: Secondary | ICD-10-CM

## 2024-03-27 MED ORDER — PREDNISONE 10 MG PO TABS: 40.0000 mg | ORAL_TABLET | Freq: Every day | ORAL | 0 refills | Status: AC

## 2024-03-27 MED ORDER — AZITHROMYCIN 250 MG PO TABS: 250.0000 mg | ORAL_TABLET | Freq: Every day | ORAL | 0 refills | Status: AC

## 2024-03-27 MED ORDER — PROMETHAZINE-DM 6.25-15 MG/5ML PO SYRP: 5.0000 mL | ORAL_SOLUTION | Freq: Four times a day (QID) | ORAL | 0 refills | Status: DC | PRN

## 2024-03-27 NOTE — ED Triage Notes (Signed)
 Patient to Urgent Care with complaints of unproductive/ congested cough. Worse at night.   Symptoms x3 weeks.  Taking mucinex  day and night w/ little relief.

## 2024-03-27 NOTE — ED Provider Notes (Signed)
 [name]    CSN: [id number] Arrival date & time: 03/27/24  1421      History   Chief Complaint Chief Complaint  Patient presents with   Cough    HPI Marissa Mcdaniel is a 68 y.o. female.  Patient presents with a nagging nonproductive cough x 3 weeks.  It is worse at night and is keeping her awake.  She denies fever or shortness of breath.  Treatment attempted with Mucinex  without relief.  The history is provided by the patient and medical records.    Past Medical History:  Diagnosis Date   Borderline diabetic    Lumbar radiculopathy    Menopause    Overweight    Rotator cuff syndrome of right shoulder     There are no active problems to display for this patient.   Past Surgical History:  Procedure Laterality Date   ABDOMINAL HYSTERECTOMY     COLONOSCOPY WITH PROPOFOL      COLONOSCOPY WITH PROPOFOL  N/A 07/04/2019   Procedure: COLONOSCOPY WITH PROPOFOL ;  Surgeon: Toledo, Ladell POUR, MD;  Location: [facility] ENDOSCOPY;  Service: Endoscopy;  Laterality: N/A;   OOPHORECTOMY      OB History   No obstetric history on file.      Home Medications    Prior to Admission medications   Medication Sig Start Date End Date Taking? Authorizing Provider  azithromycin  (ZITHROMAX ) 250 MG tablet Take 1 tablet (250 mg total) by mouth daily. Take first 2 tablets together, then 1 every day until finished. 03/27/24  Yes Corlis Burnard DEL, NP  Calcium Carbonate-Vitamin D (CALTRATE 600+D PO) Take by mouth.    [provider]  escitalopram (LEXAPRO) 5 MG tablet Take 5 mg by mouth daily. Patient not taking: Reported on 09/17/2023    [provider]  hydrOXYzine  (ATARAX ) 25 MG tablet Take 1 tablet (25 mg total) by mouth every 6 (six) hours. Patient not taking: Reported on 03/27/2024 12/30/23   Teresa Shelba SAUNDERS, NP  lidocaine  (XYLOCAINE ) 2 % solution Use as directed 15 mLs in the mouth or throat every 4 (four) hours as needed (throat pain). Patient not taking: Reported on  03/27/2024 03/01/15   Jacquetta Stabs, MD  predniSONE  (DELTASONE ) 10 MG tablet Take 4 tablets (40 mg total) by mouth daily for 5 days. 03/27/24 04/01/24 Yes Corlis Burnard DEL, NP  promethazine -dextromethorphan (PROMETHAZINE -DM) 6.25-15 MG/5ML syrup Take 5 mLs by mouth 4 (four) times daily as needed. 03/27/24  Yes Corlis Burnard DEL, NP  traMADol (ULTRAM) 50 MG tablet Take by mouth every 6 (six) hours as needed. Patient not taking: Reported on 03/27/2024    [provider]    Family History Family History  Problem Relation Age of Onset   Diabetes Father    Hypertension Father    Colon cancer Father    Breast cancer Neg Hx     Social History Social History   Tobacco Use   Smoking status: Former    Types: Cigarettes   Smokeless tobacco: Never  Vaping Use   Vaping status: Never Used  Substance Use Topics   Alcohol use: Yes    Alcohol/week: 1.0 standard drink of alcohol    Types: 1 Glasses of wine per week   Drug use: No     Allergies   Lodine [etodolac]   Review of Systems Review of Systems  Constitutional:  Negative for chills and fever.  HENT:  Negative for ear pain and sore throat.   Respiratory:  Positive for cough. Negative  for shortness of breath.      Physical Exam Triage Vital Signs ED Triage Vitals  Encounter Vitals Group     BP      Girls Systolic BP Percentile      Girls Diastolic BP Percentile      Boys Systolic BP Percentile      Boys Diastolic BP Percentile      Pulse      Resp      Temp      Temp src      SpO2      Weight      Height      Head Circumference      Peak Flow      Pain Score      Pain Loc      Pain Education      Exclude from Growth Chart    No data found.  Updated Vital Signs BP 111/71   Pulse 70   Temp 98.7 F (37.1 C)   Resp 18   SpO2 98%   Visual Acuity Right Eye Distance:   Left Eye Distance:   Bilateral Distance:    Right Eye Near:   Left Eye Near:    Bilateral Near:     Physical Exam Constitutional:       General: She is not in acute distress. HENT:     Right Ear: Tympanic membrane normal.     Left Ear: Tympanic membrane normal.     Nose: Nose normal.     Mouth/Throat:     Mouth: Mucous membranes are moist.     Pharynx: Oropharynx is clear.  Cardiovascular:     Rate and Rhythm: Normal rate and regular rhythm.     Heart sounds: Normal heart sounds.  Pulmonary:     Effort: Pulmonary effort is normal. No respiratory distress.     Breath sounds: Normal breath sounds.     Comments: Frequent nonproductive cough. Neurological:     Mental Status: She is alert.      UC Treatments / Results  Labs (all labs ordered are listed, but only abnormal results are displayed) Labs Reviewed - No data to display  EKG   Radiology No results found.  Procedures Procedures (including critical care time)  Medications Ordered in UC Medications - No data to display  Initial Impression / Assessment and Plan / UC Course  I have reviewed the triage vital signs and the nursing notes.  Pertinent labs & imaging results that were available during my care of the patient were reviewed by me and considered in my medical decision making (see chart for details).    Acute upper respiratory infection.  Patient is symptomatic for 3 weeks and is not improving with OTC treatment.  Treating today with Zithromax  and prednisone  as patient reports these worked well for her in the past.  Treating cough with Promethazine  DM; precautions for drowsiness with this medication discussed.  Education provided on upper respiratory infection.  Instructed patient to follow up with her PCP if her symptoms are not improving.  She agrees to plan of care.   Final Clinical Impressions(s) / UC Diagnoses   Final diagnoses:  Acute upper respiratory infection     Discharge Instructions      Take the Zithromax  and prednisone  as directed.    Take the promethazine  DM as directed for cough.  Do not drive, operate machinery, drink  alcohol, or perform dangerous activities while taking this medication as it may cause drowsiness.  Follow-up with your primary care provider if your symptoms are not improving.      ED Prescriptions     Medication Sig Dispense Auth. Provider   azithromycin  (ZITHROMAX ) 250 MG tablet Take 1 tablet (250 mg total) by mouth daily. Take first 2 tablets together, then 1 every day until finished. 6 tablet Corlis Burnard DEL, NP   predniSONE  (DELTASONE ) 10 MG tablet Take 4 tablets (40 mg total) by mouth daily for 5 days. 20 tablet Corlis Burnard DEL, NP   promethazine -dextromethorphan (PROMETHAZINE -DM) 6.25-15 MG/5ML syrup Take 5 mLs by mouth 4 (four) times daily as needed. 118 mL Corlis Burnard DEL, NP      PDMP not reviewed this encounter.   Corlis Burnard DEL, NP 03/27/24 [phone]

## 2024-03-27 NOTE — Discharge Instructions (Addendum)
 Take the Zithromax  and prednisone  as directed.    Take the promethazine  DM as directed for cough.  Do not drive, operate machinery, drink alcohol, or perform dangerous activities while taking this medication as it may cause drowsiness.  Follow-up with your primary care provider if your symptoms are not improving.

## 2024-04-10 DIAGNOSIS — R7303 Prediabetes: Secondary | ICD-10-CM | POA: Diagnosis not present

## 2024-04-10 DIAGNOSIS — Z136 Encounter for screening for cardiovascular disorders: Secondary | ICD-10-CM | POA: Diagnosis not present

## 2024-04-16 DIAGNOSIS — Z0000 Encounter for general adult medical examination without abnormal findings: Secondary | ICD-10-CM | POA: Diagnosis not present

## 2024-04-16 DIAGNOSIS — R7303 Prediabetes: Secondary | ICD-10-CM | POA: Diagnosis not present

## 2024-04-16 DIAGNOSIS — E66811 Obesity, class 1: Secondary | ICD-10-CM | POA: Diagnosis not present

## 2024-04-16 DIAGNOSIS — Z6833 Body mass index (BMI) 33.0-33.9, adult: Secondary | ICD-10-CM | POA: Diagnosis not present

## 2024-04-16 DIAGNOSIS — E6609 Other obesity due to excess calories: Secondary | ICD-10-CM | POA: Diagnosis not present

## 2024-05-07 ENCOUNTER — Other Ambulatory Visit: Payer: Self-pay | Admitting: Family Medicine

## 2024-05-07 DIAGNOSIS — Z1231 Encounter for screening mammogram for malignant neoplasm of breast: Secondary | ICD-10-CM

## 2024-06-04 ENCOUNTER — Ambulatory Visit
Admission: RE | Admit: 2024-06-04 | Discharge: 2024-06-04 | Disposition: A | Discharge status: Home / Self Care | Attending: Emergency Medicine | Admitting: Emergency Medicine

## 2024-06-04 VITALS — BP 108/70 | HR 77 | Temp 99.0°F | Resp 18 | Wt 188.8 lb

## 2024-06-04 DIAGNOSIS — U071 COVID-19: Secondary | ICD-10-CM | POA: Diagnosis not present

## 2024-06-04 LAB — POC SOFIA SARS ANTIGEN FIA: SARS Coronavirus 2 Ag: POSITIVE — AB

## 2024-06-04 MED ORDER — PAXLOVID (300/100) 20 X 150 MG & 10 X 100MG PO TBPK: 3.0000 | ORAL_TABLET | Freq: Two times a day (BID) | ORAL | 0 refills | Status: AC

## 2024-06-04 NOTE — ED Triage Notes (Signed)
 Patient to Urgent Care with complaints of cough/ headache/ body aches/ nasal congestion. Husband Covid positive.   Symptoms x1 day.   Robitussin last night.

## 2024-06-04 NOTE — Discharge Instructions (Addendum)
 Take the Paxlovid as directed.  Take Tylenol as needed for fever or discomfort.  Rest and keep yourself hydrated.    Follow up with your primary care provider tomorrow.  Go to the emergency department if you have worsening symptoms.

## 2024-06-04 NOTE — ED Provider Notes (Signed)
 Marissa Mcdaniel    CSN: [id number] Arrival date & time: 06/04/24  1042      History   Chief Complaint Chief Complaint  Patient presents with   Cough    Had  a headache since yesterday . Aching - Entered by patient    HPI Marissa Mcdaniel is a 68 y.o. female.   Patient was seen here on 03/27/2024; diagnosed with acute upper respiratory infection; treated with Zithromax , prednisone , Promethazine  DM.  Her medical history includes obesity and borderline diabetes.   Cough   Past Medical History:  Diagnosis Date   Borderline diabetic    Lumbar radiculopathy    Menopause    Overweight    Rotator cuff syndrome of right shoulder     There are no active problems to display for this patient.   Past Surgical History:  Procedure Laterality Date   ABDOMINAL HYSTERECTOMY     COLONOSCOPY WITH PROPOFOL      COLONOSCOPY WITH PROPOFOL  N/A 07/04/2019   Procedure: COLONOSCOPY WITH PROPOFOL ;  Surgeon: Toledo, Ladell POUR, MD;  Location: [facility] ENDOSCOPY;  Service: Endoscopy;  Laterality: N/A;   OOPHORECTOMY      OB History   No obstetric history on file.      Home Medications    Prior to Admission medications   Medication Sig Start Date End Date Taking? Authorizing Provider  nirmatrelvir/ritonavir (PAXLOVID, 300/100,) 20 x 150 MG & 10 x 100MG  TBPK Take 3 tablets by mouth 2 (two) times daily for 5 days. 06/04/24 06/09/24 Yes Corlis Burnard DEL, NP  azithromycin  (ZITHROMAX ) 250 MG tablet Take 1 tablet (250 mg total) by mouth daily. Take first 2 tablets together, then 1 every day until finished. Patient not taking: Reported on 06/04/2024 03/27/24   Corlis Burnard DEL, NP  Calcium Carbonate-Vitamin D (CALTRATE 600+D PO) Take by mouth.    [provider]  escitalopram (LEXAPRO) 5 MG tablet Take 5 mg by mouth daily. Patient not taking: Reported on 09/17/2023    [provider]  hydrOXYzine  (ATARAX ) 25 MG tablet Take 1 tablet (25 mg total) by mouth every 6 (six) hours. Patient  not taking: Reported on 03/27/2024 12/30/23   Teresa Shelba SAUNDERS, NP  lidocaine  (XYLOCAINE ) 2 % solution Use as directed 15 mLs in the mouth or throat every 4 (four) hours as needed (throat pain). Patient not taking: Reported on 03/27/2024 03/01/15   Jacquetta Stabs, MD  promethazine -dextromethorphan (PROMETHAZINE -DM) 6.25-15 MG/5ML syrup Take 5 mLs by mouth 4 (four) times daily as needed. Patient not taking: Reported on 06/04/2024 03/27/24   Corlis Burnard DEL, NP  traMADol (ULTRAM) 50 MG tablet Take by mouth every 6 (six) hours as needed. Patient not taking: Reported on 03/27/2024    [provider]    Family History Family History  Problem Relation Age of Onset   Diabetes Father    Hypertension Father    Colon cancer Father    Breast cancer Neg Hx     Social History Social History   Tobacco Use   Smoking status: Former    Types: Cigarettes   Smokeless tobacco: Never  Vaping Use   Vaping status: Never Used  Substance Use Topics   Alcohol use: Yes    Alcohol/week: 1.0 standard drink of alcohol    Types: 1 Glasses of wine per week   Drug use: No     Allergies   Lodine [etodolac]   Review of Systems Review of Systems  Respiratory:  Positive for cough.  Physical Exam Triage Vital Signs ED Triage Vitals  Encounter Vitals Group     BP 06/04/24 1058 108/70     Girls Systolic BP Percentile --      Girls Diastolic BP Percentile --      Boys Systolic BP Percentile --      Boys Diastolic BP Percentile --      Pulse Rate 06/04/24 1058 77     Resp 06/04/24 1058 18     Temp 06/04/24 1058 99 F (37.2 C)     Temp src --      SpO2 06/04/24 1058 98 %     Weight --      Height --      Head Circumference --      Peak Flow --      Pain Score 06/04/24 1110 8     Pain Loc --      Pain Education --      Exclude from Growth Chart --    No data found.  Updated Vital Signs BP 108/70   Pulse 77   Temp 99 F (37.2 C)   Resp 18   Wt 188 lb 12.8 oz (85.6 kg)   SpO2 98%    BMI 31.42 kg/m   Visual Acuity Right Eye Distance:   Left Eye Distance:   Bilateral Distance:    Right Eye Near:   Left Eye Near:    Bilateral Near:     Physical Exam   UC Treatments / Results  Labs (all labs ordered are listed, but only abnormal results are displayed) Labs Reviewed  POC SOFIA SARS ANTIGEN FIA - Abnormal; Notable for the following components:      Result Value   SARS Coronavirus 2 Ag Positive (*)    All other components within normal limits    EKG   Radiology No results found.  Procedures Procedures (including critical care time)  Medications Ordered in UC Medications - No data to display  Initial Impression / Assessment and Plan / UC Course  I have reviewed the triage vital signs and the nursing notes.  Pertinent labs & imaging results that were available during my care of the patient were reviewed by me and considered in my medical decision making (see chart for details).   COVID-19.  Afebrile and vital signs are stable.  Lungs are clear and O2 sat is 98% on room air.  COVID test is positive.  Creatinine clearance 74 based on most recent lab work from 04/10/2024 which showed GFR of 63 and creatinine of 1.0.  Treating with Paxlovid.  Discussed that this medication is emergency authorized for treatment of COVID.  Discussed the side effects of Paxlovid, including dysgeusia, diarrhea, myalgias, hypertension.  Also discussed the possibility of rebound COVID.  Instructed her to pause her statin while on Paxlovid and then for 5 additional days.  Instructed patient to follow-up with her PCP.  ED precautions given.  Patient agrees to plan of care.    Final Clinical Impressions(s) / UC Diagnoses   Final diagnoses:  COVID-19     Discharge Instructions      Take the Paxlovid as directed.  Take Tylenol as needed for fever or discomfort.  Rest and keep yourself hydrated.    Follow up with your primary care provider tomorrow.  Go to the emergency department  if you have worsening symptoms.          ED Prescriptions     Medication Sig Dispense Auth. Provider  nirmatrelvir/ritonavir (PAXLOVID, 300/100,) 20 x 150 MG & 10 x 100MG  TBPK Take 3 tablets by mouth 2 (two) times daily for 5 days. 30 tablet Corlis Burnard DEL, NP      PDMP not reviewed this encounter.   Corlis Burnard DEL, NP 06/04/24 1139

## 2024-06-25 ENCOUNTER — Ambulatory Visit
Admission: RE | Admit: 2024-06-25 | Discharge: 2024-06-25 | Disposition: A | Discharge status: Home / Self Care | Source: Ambulatory Visit | Attending: Family Medicine | Admitting: Family Medicine

## 2024-06-25 DIAGNOSIS — Z1231 Encounter for screening mammogram for malignant neoplasm of breast: Secondary | ICD-10-CM | POA: Insufficient documentation

## 2024-06-26 DIAGNOSIS — G629 Polyneuropathy, unspecified: Secondary | ICD-10-CM | POA: Diagnosis not present

## 2024-06-26 DIAGNOSIS — R351 Nocturia: Secondary | ICD-10-CM | POA: Diagnosis not present

## 2024-06-26 DIAGNOSIS — Z79899 Other long term (current) drug therapy: Secondary | ICD-10-CM | POA: Diagnosis not present

## 2024-07-23 DIAGNOSIS — L218 Other seborrheic dermatitis: Secondary | ICD-10-CM | POA: Diagnosis not present

## 2024-08-21 ENCOUNTER — Ambulatory Visit
Admission: RE | Admit: 2024-08-21 | Discharge: 2024-08-21 | Disposition: A | Discharge status: Home / Self Care | Payer: Self-pay | Source: Ambulatory Visit | Attending: Emergency Medicine | Admitting: Emergency Medicine

## 2024-08-21 VITALS — BP 103/67 | HR 64 | Temp 97.7°F | Resp 17

## 2024-08-21 DIAGNOSIS — M79644 Pain in right finger(s): Secondary | ICD-10-CM

## 2024-08-21 DIAGNOSIS — J069 Acute upper respiratory infection, unspecified: Secondary | ICD-10-CM

## 2024-08-21 DIAGNOSIS — M79645 Pain in left finger(s): Secondary | ICD-10-CM

## 2024-08-21 MED ORDER — MELOXICAM 7.5 MG PO TABS: 7.5000 mg | ORAL_TABLET | Freq: Every day | ORAL | 0 refills | Status: AC

## 2024-08-21 MED ORDER — KETOROLAC TROMETHAMINE 30 MG/ML IJ SOLN
30.0000 mg | Freq: Once | INTRAMUSCULAR | Status: AC
  Administered 2024-08-21: 30 mg via INTRAMUSCULAR

## 2024-08-21 MED ORDER — PROMETHAZINE-DM 6.25-15 MG/5ML PO SYRP: 5.0000 mL | ORAL_SOLUTION | Freq: Four times a day (QID) | ORAL | 0 refills | Status: AC | PRN

## 2024-08-21 NOTE — Discharge Instructions (Addendum)
 Today you are evaluated for the pain and swelling to your middle fingers which I do believe is most likely inflammatory condition such as arthritis due to the frequent hand use while you are working  You have been given an injection of Toradol  to help reduce inflammation and pain and ideally will start to see relief within the next 30 minutes to an hour  Start tomorrow you may take meloxicam 1 to 2 tablets every morning with food, take consistently for at least 3 to 5 days then may use as needed  You may take Tylenol and use any topical medicines in addition to this  Hand exercises and stretches have been placed in your packet for you to complete to further assist with discomfort  You may apply heating pad to the hand and 10 to 15-minute intervals to help reduce swelling  If your symptoms continue to persist or worsen you may follow-up with your primary doctor or orthopedic specialist whose information is offered.  Your symptoms today are most likely being caused by a virus and should steadily improve in time it can take up to 7 to 10 days before you truly start to see a turnaround however things will get better  Use cough syrup every 6 hours as needed for comfort    You can take Tylenol and/or Ibuprofen as needed for fever reduction and pain relief.   For cough: honey 1/2 to 1 teaspoon (you can dilute the honey in water or another fluid).  You can also use guaifenesin  and dextromethorphan for cough. You can use a humidifier for chest congestion and cough.  If you don't have a humidifier, you can sit in the bathroom with the hot shower running.      For sore throat: try warm salt water gargles, cepacol lozenges, throat spray, warm tea or water with lemon/honey, popsicles or ice, or OTC cold relief medicine for throat discomfort.   For congestion: take a daily anti-histamine like Zyrtec, Claritin, and a oral decongestant, such as pseudoephedrine.  You can also use Flonase 1-2 sprays in each  nostril daily.   It is important to stay hydrated: drink plenty of fluids (water, gatorade/powerade/pedialyte, juices, or teas) to keep your throat moisturized and help further relieve irritation/discomfort.

## 2024-08-21 NOTE — ED Provider Notes (Signed)
 " [name]    CSN: [id number] Arrival date & time: 08/21/24  0911      History   Chief Complaint Chief Complaint  Patient presents with   Hand Problem    Swelling in hands - Entered by patient   Cough   Hand Pain   Nasal Congestion   Joint Swelling    HPI Marissa Mcdaniel is a 68 y.o. female.   Patient presents for evaluation of pain and swelling to the bilateral middle fingers present for 1 month.  Pain has been constant making it difficult to bend.  Denies presence of numbness, tingling or injury.  Has attempted use of Aleve with some relief.,  Works as a interior and spatial designer.  Patient concerned with nasal congestion and a nonproductive cough present for 4 days.  Has a tickle to the throat that she feels like is exacerbating cough.  Known exposure to influenza over the holiday.  Denies presence of fever, shortness of breath wheezing or abdominal symptoms.  Tolerable to food and liquids but appetite is decreased.  Has attempted use of Mucinex .      Past Medical History:  Diagnosis Date   Borderline diabetic    Lumbar radiculopathy    Menopause    Overweight    Rotator cuff syndrome of right shoulder     There are no active problems to display for this patient.   Past Surgical History:  Procedure Laterality Date   ABDOMINAL HYSTERECTOMY     COLONOSCOPY WITH PROPOFOL      COLONOSCOPY WITH PROPOFOL  N/A 07/04/2019   Procedure: COLONOSCOPY WITH PROPOFOL ;  Surgeon: Toledo, Ladell POUR, MD;  Location: [facility] ENDOSCOPY;  Service: Endoscopy;  Laterality: N/A;   OOPHORECTOMY      OB History   No obstetric history on file.      Home Medications    Prior to Admission medications  Medication Sig Start Date End Date Taking? Authorizing Provider  azithromycin  (ZITHROMAX ) 250 MG tablet Take 1 tablet (250 mg total) by mouth daily. Take first 2 tablets together, then 1 every day until finished. Patient not taking: Reported on 06/04/2024 03/27/24   Corlis Burnard DEL, NP   Calcium Carbonate-Vitamin D (CALTRATE 600+D PO) Take by mouth.    [provider]  escitalopram (LEXAPRO) 5 MG tablet Take 5 mg by mouth daily. Patient not taking: Reported on 09/17/2023    [provider]  hydrOXYzine  (ATARAX ) 25 MG tablet Take 1 tablet (25 mg total) by mouth every 6 (six) hours. Patient not taking: Reported on 03/27/2024 12/30/23   Teresa Shelba SAUNDERS, NP  lidocaine  (XYLOCAINE ) 2 % solution Use as directed 15 mLs in the mouth or throat every 4 (four) hours as needed (throat pain). Patient not taking: Reported on 03/27/2024 03/01/15   Jacquetta Stabs, MD  promethazine -dextromethorphan (PROMETHAZINE -DM) 6.25-15 MG/5ML syrup Take 5 mLs by mouth 4 (four) times daily as needed. Patient not taking: Reported on 06/04/2024 03/27/24   Corlis Burnard DEL, NP  traMADol (ULTRAM) 50 MG tablet Take by mouth every 6 (six) hours as needed. Patient not taking: Reported on 03/27/2024    [provider]    Family History Family History  Problem Relation Age of Onset   Diabetes Father    Hypertension Father    Colon cancer Father    Breast cancer Neg Hx     Social History Social History[1]   Allergies   Lodine [etodolac]   Review of Systems Review of Systems  HENT:  Positive for congestion and  sore throat. Negative for dental problem, drooling, ear discharge, ear pain, facial swelling, hearing loss, mouth sores, nosebleeds, postnasal drip, rhinorrhea, sinus pressure, sinus pain, sneezing, tinnitus, trouble swallowing and voice change.   Respiratory:  Positive for cough. Negative for apnea, choking, chest tightness, shortness of breath, wheezing and stridor.   Gastrointestinal: Negative.      Physical Exam Triage Vital Signs ED Triage Vitals  Encounter Vitals Group     BP 08/21/24 0957 103/67     Girls Systolic BP Percentile --      Girls Diastolic BP Percentile --      Boys Systolic BP Percentile --      Boys Diastolic BP Percentile --      Pulse Rate 08/21/24  0957 64     Resp 08/21/24 0957 17     Temp 08/21/24 0957 97.7 F (36.5 C)     Temp Source 08/21/24 0957 Oral     SpO2 08/21/24 0957 97 %     Weight --      Height --      Head Circumference --      Peak Flow --      Pain Score 08/21/24 1001 8     Pain Loc --      Pain Education --      Exclude from Growth Chart --    No data found.  Updated Vital Signs BP 103/67 (BP Location: Right Arm)   Pulse 64   Temp 97.7 F (36.5 C) (Oral)   Resp 17   SpO2 97%   Visual Acuity Right Eye Distance:   Left Eye Distance:   Bilateral Distance:    Right Eye Near:   Left Eye Near:    Bilateral Near:     Physical Exam Constitutional:      Appearance: Normal appearance.  HENT:     Right Ear: Tympanic membrane, ear canal and external ear normal.     Left Ear: Tympanic membrane, ear canal and external ear normal.     Nose: Congestion present.     Mouth/Throat:     Mouth: Mucous membranes are moist.     Pharynx: Oropharynx is clear. No oropharyngeal exudate or posterior oropharyngeal erythema.  Eyes:     Extraocular Movements: Extraocular movements intact.  Cardiovascular:     Rate and Rhythm: Normal rate and regular rhythm.     Pulses: Normal pulses.     Heart sounds: Normal heart sounds.  Pulmonary:     Effort: Pulmonary effort is normal.     Breath sounds: Normal breath sounds.  Musculoskeletal:     Comments: Tenderness present to the proximal and distal phalanx of the bilateral third fingers, mild to moderate swelling noted, limited range of motion unable to fully flex but able to fully extend, 2+ radial pulses  Neurological:     Mental Status: She is alert and oriented to person, place, and time. Mental status is at baseline.      UC Treatments / Results  Labs (all labs ordered are listed, but only abnormal results are displayed) Labs Reviewed - No data to display  EKG   Radiology No results found.  Procedures Procedures (including critical care  time)  Medications Ordered in UC Medications - No data to display  Initial Impression / Assessment and Plan / UC Course  I have reviewed the triage vital signs and the nursing notes.  Pertinent labs & imaging results that were available during my care of the patient were reviewed  by me and considered in my medical decision making (see chart for details).  Pain in finger of both hands, viral URI with cough  Etiology of fingers most likely inflammatory, denies injury therefore imaging deferred, discussed this with patient, Toradol  IM given and prescribed meloxicam for home use as well as given hand exercises and stretches, recommended supportive care through [facility] and advised primary orthopedic follow-up  Patient is in no signs of distress nor toxic appearing.  Vital signs are stable.  Low suspicion for pneumonia, pneumothorax or bronchitis and therefore will defer imaging.  Etiology most likely viral, discussed this with patient, prescribed Promethazine  DM, deferred use of Tessalon if she deems it effective.May use additional over-the-counter medications as needed for supportive care.  May follow-up with urgent care as needed if symptoms persist or worsen.   Final Clinical Impressions(s) / UC Diagnoses   Final diagnoses:  None   Discharge Instructions   None    ED Prescriptions   None    PDMP not reviewed this encounter.     [1]  Social History Tobacco Use   Smoking status: Former    Types: Cigarettes   Smokeless tobacco: Never  Vaping Use   Vaping status: Never Used  Substance Use Topics   Alcohol use: Yes    Alcohol/week: 1.0 standard drink of alcohol    Types: 1 Glasses of wine per week   Drug use: No     Teresa Shelba SAUNDERS, NP 08/21/24 1041  "

## 2024-08-21 NOTE — ED Triage Notes (Signed)
 Patient reports pain and swelling x 1 month. Patient took Aleve with mild relief. Patient denies injury. Patient also complains of cough and nasal congestion and patient states she was exposed to flu on christmas from here sister. Patient states her symptoms has been  x 4 days.

## 2024-09-11 ENCOUNTER — Other Ambulatory Visit: Payer: Self-pay

## 2024-09-11 ENCOUNTER — Encounter: Payer: Self-pay | Admitting: Internal Medicine

## 2024-09-11 ENCOUNTER — Ambulatory Visit
Admission: RE | Admit: 2024-09-11 | Discharge: 2024-09-11 | Disposition: A | Discharge status: Home / Self Care | Attending: Internal Medicine | Admitting: Internal Medicine

## 2024-09-11 ENCOUNTER — Ambulatory Visit: Admitting: Anesthesiology

## 2024-09-11 ENCOUNTER — Encounter
Admission: RE | Disposition: A | Discharge status: Home / Self Care | Payer: Self-pay | Source: Home / Self Care | Attending: Internal Medicine

## 2024-09-11 DIAGNOSIS — K635 Polyp of colon: Secondary | ICD-10-CM | POA: Diagnosis not present

## 2024-09-11 DIAGNOSIS — K5730 Diverticulosis of large intestine without perforation or abscess without bleeding: Secondary | ICD-10-CM | POA: Diagnosis not present

## 2024-09-11 DIAGNOSIS — Z800 Family history of malignant neoplasm of digestive organs: Secondary | ICD-10-CM | POA: Insufficient documentation

## 2024-09-11 DIAGNOSIS — Z1211 Encounter for screening for malignant neoplasm of colon: Secondary | ICD-10-CM | POA: Insufficient documentation

## 2024-09-11 DIAGNOSIS — Z87891 Personal history of nicotine dependence: Secondary | ICD-10-CM | POA: Diagnosis not present

## 2024-09-11 HISTORY — PX: COLONOSCOPY: SHX5424

## 2024-09-11 HISTORY — PX: POLYPECTOMY: SHX149

## 2024-09-11 MED ORDER — LIDOCAINE HCL (CARDIAC) PF 100 MG/5ML IV SOSY
PREFILLED_SYRINGE | INTRAVENOUS | Status: DC | PRN
  Administered 2024-09-11: 40 mg via INTRAVENOUS

## 2024-09-11 MED ORDER — PHENYLEPHRINE 80 MCG/ML (10ML) SYRINGE FOR IV PUSH (FOR BLOOD PRESSURE SUPPORT)
PREFILLED_SYRINGE | INTRAVENOUS | Status: DC | PRN
  Administered 2024-09-11: 40 ug via INTRAVENOUS
  Administered 2024-09-11: 80 ug via INTRAVENOUS
  Administered 2024-09-11: 120 ug via INTRAVENOUS

## 2024-09-11 MED ORDER — PROPOFOL 500 MG/50ML IV EMUL
INTRAVENOUS | Status: DC | PRN
  Administered 2024-09-11: 140 ug/kg/min via INTRAVENOUS

## 2024-09-11 MED ORDER — PROPOFOL 10 MG/ML IV BOLUS
INTRAVENOUS | Status: DC | PRN
  Administered 2024-09-11: 80 mg via INTRAVENOUS
  Administered 2024-09-11: 70 mg via INTRAVENOUS

## 2024-09-11 MED ORDER — PHENYLEPHRINE 80 MCG/ML (10ML) SYRINGE FOR IV PUSH (FOR BLOOD PRESSURE SUPPORT)
PREFILLED_SYRINGE | INTRAVENOUS | Status: AC
  Filled 2024-09-11: qty 10

## 2024-09-11 MED ORDER — SODIUM CHLORIDE 0.9 % IV SOLN: INTRAVENOUS | Status: DC

## 2024-09-11 MED ORDER — PROPOFOL 10 MG/ML IV BOLUS
INTRAVENOUS | Status: AC
  Filled 2024-09-11: qty 40

## 2024-09-11 MED ORDER — LIDOCAINE HCL (PF) 2 % IJ SOLN
INTRAMUSCULAR | Status: AC
  Filled 2024-09-11: qty 5

## 2024-09-11 NOTE — Anesthesia Preprocedure Evaluation (Signed)
"                                    Anesthesia Evaluation  Patient identified by MRN, date of birth, ID band Patient awake    Reviewed: Allergy & Precautions, H&P , NPO status , Patient's Chart, lab work & pertinent test results, reviewed documented beta blocker date and time   Airway Mallampati: II   Neck ROM: full    Dental  (+) Poor Dentition   Pulmonary neg pulmonary ROS, former smoker   Pulmonary exam normal        Cardiovascular Exercise Tolerance: Good negative cardio ROS Normal cardiovascular exam Rhythm:regular Rate:Normal     Neuro/Psych  Neuromuscular disease  negative psych ROS   GI/Hepatic negative GI ROS, Neg liver ROS,,,  Endo/Other  negative endocrine ROS    Renal/GU negative Renal ROS  negative genitourinary   Musculoskeletal   Abdominal   Peds  Hematology negative hematology ROS (+)   Anesthesia Other Findings Past Medical History: No date: Borderline diabetic No date: Lumbar radiculopathy No date: Menopause No date: Overweight No date: Rotator cuff syndrome of right shoulder Past Surgical History: No date: ABDOMINAL HYSTERECTOMY No date: COLONOSCOPY WITH PROPOFOL  07/04/2019: COLONOSCOPY WITH PROPOFOL ; N/A     Comment:  Procedure: COLONOSCOPY WITH PROPOFOL ;  Surgeon: Toledo,               Ladell POUR, MD;  Location: [facility] ENDOSCOPY;  Service:               Endoscopy;  Laterality: N/A; No date: OOPHORECTOMY BMI    Body Mass Index: 30.55 kg/m     Reproductive/Obstetrics negative OB ROS                              Anesthesia Physical Anesthesia Plan  ASA: 2  Anesthesia Plan: General   Post-op Pain Management:    Induction:   PONV Risk Score and Plan:   Airway Management Planned:   Additional Equipment:   Intra-op Plan:   Post-operative Plan:   Informed Consent: I have reviewed the patients History and Physical, chart, labs and discussed the procedure including the risks, benefits and  alternatives for the proposed anesthesia with the patient or authorized representative who has indicated his/her understanding and acceptance.     Dental Advisory Given  Plan Discussed with: CRNA  Anesthesia Plan Comments:         Anesthesia Quick Evaluation  "

## 2024-09-11 NOTE — Transfer of Care (Signed)
 Immediate Anesthesia Transfer of Care Note  Patient: Marissa Mcdaniel  Procedure(s) Performed: COLONOSCOPY POLYPECTOMY, INTESTINE  Patient Location: Endoscopy Unit  Anesthesia Type:General  Level of Consciousness: awake  Airway & Oxygen Therapy: Patient Spontanous Breathing  Post-op Assessment: Report given to RN and Post -op Vital signs reviewed and stable  Post vital signs: Reviewed and stable  Last Vitals:  Vitals Value Taken Time  BP 103/69 09/11/24 08:33  Temp    Pulse 76 09/11/24 08:34  Resp 14 09/11/24 08:34  SpO2 99 % 09/11/24 08:34  Vitals shown include unfiled device data.  Last Pain:  Vitals:   09/11/24 0742  TempSrc: Temporal  PainSc: 0-No pain         Complications: No notable events documented.

## 2024-09-11 NOTE — Anesthesia Postprocedure Evaluation (Signed)
"   Anesthesia Post Note  Patient: Marissa Mcdaniel  Procedure(s) Performed: COLONOSCOPY POLYPECTOMY, INTESTINE  Patient location during evaluation: PACU Anesthesia Type: General Level of consciousness: awake and alert Pain management: pain level controlled Vital Signs Assessment: post-procedure vital signs reviewed and stable Respiratory status: spontaneous breathing, nonlabored ventilation, respiratory function stable and patient connected to nasal cannula oxygen Cardiovascular status: blood pressure returned to baseline and stable Postop Assessment: no apparent nausea or vomiting Anesthetic complications: no   No notable events documented.   Last Vitals:  Vitals:   09/11/24 0833 09/11/24 0849  BP: (!) 82/45 (!) 103/59  Pulse: 78 74  Resp: (!) 21 16  Temp:    SpO2: 99% 100%    Last Pain:  Vitals:   09/11/24 0849  TempSrc:   PainSc: 0-No pain                 Lynwood KANDICE Clause      "

## 2024-09-11 NOTE — Op Note (Signed)
 [facility] Patient Name: Marissa Mcdaniel Procedure Date: 09/11/2024 8:01 AM MRN: [id number] Account #: [id number] Date of Birth: 03/28/1956 Admit Type: Outpatient Age: 69 Room: [facility] ENDO ROOM 2 Gender: Female Note Status: Finalized Instrument Name: Colon Scope [id number] Procedure:             Colonoscopy Indications:           Screening in patient at increased risk: Family history                         of 1st-degree relative with colorectal cancer, Colon                         cancer screening Providers:             Aeric Burnham K. Aundria MD, MD Referring MD:          [facility] (Referring MD) Medicines:             Propofol  per Anesthesia Complications:         No immediate complications. Estimated blood loss:                         Minimal. Procedure:             Pre-Anesthesia Assessment:                        - The risks and benefits of the procedure and the                         sedation options and risks were discussed with the                         patient. All questions were answered and informed                         consent was obtained.                        - Patient identification and proposed procedure were                         verified prior to the procedure by the nurse. The                         procedure was verified in the procedure room.                        - ASA Grade Assessment: III - A patient with severe                         systemic disease.                        - After reviewing the risks and benefits, the patient                         was deemed in satisfactory condition to undergo the  procedure.                        After obtaining informed consent, the colonoscope was                         passed under direct vision. Throughout the procedure,                         the patient's blood pressure, pulse, and oxygen                         saturations were  monitored continuously. The was                         introduced through the anus and advanced to the the                         cecum, identified by appendiceal orifice and ileocecal                         valve. The colonoscopy was performed without                         difficulty. The patient tolerated the procedure well.                         The quality of the bowel preparation was good. The                         ileocecal valve, appendiceal orifice, and rectum were                         photographed. Findings:      The perianal and digital rectal examinations were normal. Pertinent       negatives include normal sphincter tone and no palpable rectal lesions.      Many medium-mouthed and small-mouthed diverticula were found in the       sigmoid colon and ascending colon.      Three sessile polyps were found in the sigmoid colon. The polyps were 3       to 4 mm in size. These polyps were removed with a piecemeal technique       using a cold biopsy forceps. Resection and retrieval were complete.       Estimated blood loss was minimal.      The exam was otherwise without abnormality on direct and retroflexion       views. Impression:            - Diverticulosis in the sigmoid colon and in the                         ascending colon.                        - Three 3 to 4 mm polyps in the sigmoid colon, removed                         piecemeal using a cold biopsy forceps. Resected and  retrieved.                        - The examination was otherwise normal on direct and                         retroflexion views. Recommendation:        - Patient has a contact number available for                         emergencies. The signs and symptoms of potential                         delayed complications were discussed with the patient.                         Return to normal activities tomorrow. Written                         discharge instructions were  provided to the patient.                        - Resume previous diet.                        - Continue present medications.                        - Await pathology results.                        - Repeat colonoscopy in 5 years for screening purposes.                        - Return to GI office PRN.                        - The findings and recommendations were discussed with                         the patient. Procedure Code(s):     --- Professional ---                        [phone], Colonoscopy, flexible; with biopsy, single or                         multiple Diagnosis Code(s):     --- Professional ---                        K57.30, Diverticulosis of large intestine without                         perforation or abscess without bleeding                        D12.5, Benign neoplasm of sigmoid colon                        Z80.0, Family history of malignant neoplasm of  digestive organs CPT copyright 2022 American [employer] Association. All rights reserved. The codes documented in this report are preliminary and upon coder review may  be revised to meet current compliance requirements. Ladell MARLA Boss MD, MD 09/11/2024 8:30:14 AM This report has been signed electronically. Number of Addenda: 0 Note Initiated On: 09/11/2024 8:01 AM Scope Withdrawal Time: 0 hours 6 minutes 34 seconds  Total Procedure Duration: 0 hours 10 minutes 34 seconds  Estimated Blood Loss:  Estimated blood loss was minimal.      [facility]

## 2024-09-11 NOTE — Interval H&P Note (Signed)
 History and Physical Interval Note:  09/11/2024 8:06 AM  Marissa Mcdaniel  has presented today for surgery, with the diagnosis of Family History of Colon Cancer.  The various methods of treatment have been discussed with the patient and family. After consideration of risks, benefits and other options for treatment, the patient has consented to  Procedures: COLONOSCOPY (N/A) as a surgical intervention.  The patient's history has been reviewed, patient examined, no change in status, stable for surgery.  I have reviewed the patient's chart and labs.  Questions were answered to the patient's satisfaction.     [place], Jeanean Hollett

## 2024-09-11 NOTE — H&P (Signed)
 Outpatient short stay form Pre-procedure 09/11/2024 8:04 AM Marissa Mcdaniel, M.D.  Primary Physician: Marissa Mcdaniel,, M.D.  Reason for visit:  Family history of colon cancer  History of present illness:   69 year old patient presenting for family history of colon cancer. Patient denies any change in bowel habits, rectal bleeding or involuntary weight loss.    Current Medications[1]  Medications Prior to Admission  Medication Sig Dispense Refill Last Dose/Taking   azithromycin  (ZITHROMAX ) 250 MG tablet Take 1 tablet (250 mg total) by mouth daily. Take first 2 tablets together, then 1 every day until finished. (Patient not taking: Reported on 06/04/2024) 6 tablet 0    Calcium Carbonate-Vitamin D (CALTRATE 600+D PO) Take by mouth.      escitalopram (LEXAPRO) 5 MG tablet Take 5 mg by mouth daily. (Patient not taking: Reported on 09/17/2023)      hydrOXYzine  (ATARAX ) 25 MG tablet Take 1 tablet (25 mg total) by mouth every 6 (six) hours. (Patient not taking: Reported on 03/27/2024) 12 tablet 0    lidocaine  (XYLOCAINE ) 2 % solution Use as directed 15 mLs in the mouth or throat every 4 (four) hours as needed (throat pain). (Patient not taking: Reported on 03/27/2024) 100 mL 0    meloxicam  (MOBIC ) 7.5 MG tablet Take 1 tablet (7.5 mg total) by mouth daily. 30 tablet 0    promethazine -dextromethorphan (PROMETHAZINE -DM) 6.25-15 MG/5ML syrup Take 5 mLs by mouth 4 (four) times daily as needed. 118 mL 0    traMADol (ULTRAM) 50 MG tablet Take by mouth every 6 (six) hours as needed. (Patient not taking: Reported on 03/27/2024)        Allergies[2]   Past Medical History:  Diagnosis Date   Borderline diabetic    Lumbar radiculopathy    Menopause    Overweight    Rotator cuff syndrome of right shoulder     Review of systems:  Otherwise negative.    Physical Exam  Gen: Alert, oriented. Appears stated age.  HEENT: [place]/AT. PERRLA. Lungs: CTA, no wheezes. CV: RR nl S1, S2. Abd: soft, benign, no  masses. BS+ Ext: No edema. Pulses 2+    Planned procedures: Proceed with colonoscopy. The patient understands the nature of the planned procedure, indications, risks, alternatives and potential complications including but not limited to bleeding, infection, perforation, damage to internal organs and possible oversedation/side effects from anesthesia. The patient agrees and gives consent to proceed.  Please refer to procedure notes for findings, recommendations and patient disposition/instructions.     Marissa Mcdaniel, M.D. Gastroenterology 09/11/2024  8:04 AM          [1]  Current Facility-Administered Medications:    0.9 %  sodium chloride  infusion, , Intravenous, Continuous, Amere Bricco K, MD, Last Rate: 20 mL/hr at 09/11/24 0803, Continued from Pre-op at 09/11/24 0803 [2]  Allergies Allergen Reactions   Lodine [Etodolac] Hives

## 2024-09-12 LAB — SURGICAL PATHOLOGY
